# Patient Record
Sex: Male | Born: 1945
Health system: Southern US, Community
[De-identification: ages and names within clinical notes are randomized; demographics above are authoritative.]

## PROBLEM LIST (undated history)

## (undated) DIAGNOSIS — M810 Age-related osteoporosis without current pathological fracture: Secondary | ICD-10-CM

## (undated) DIAGNOSIS — K635 Polyp of colon: Secondary | ICD-10-CM

## (undated) DIAGNOSIS — R7301 Impaired fasting glucose: Secondary | ICD-10-CM

## (undated) DIAGNOSIS — I2510 Atherosclerotic heart disease of native coronary artery without angina pectoris: Secondary | ICD-10-CM

## (undated) DIAGNOSIS — Z862 Personal history of diseases of the blood and blood-forming organs and certain disorders involving the immune mechanism: Secondary | ICD-10-CM

## (undated) DIAGNOSIS — E785 Hyperlipidemia, unspecified: Secondary | ICD-10-CM

## (undated) DIAGNOSIS — E7800 Pure hypercholesterolemia, unspecified: Secondary | ICD-10-CM

## (undated) DIAGNOSIS — I319 Disease of pericardium, unspecified: Secondary | ICD-10-CM

## (undated) DIAGNOSIS — L570 Actinic keratosis: Secondary | ICD-10-CM

## (undated) DIAGNOSIS — I25119 Atherosclerotic heart disease of native coronary artery with unspecified angina pectoris: Secondary | ICD-10-CM

## (undated) DIAGNOSIS — G7000 Myasthenia gravis without (acute) exacerbation: Secondary | ICD-10-CM

## (undated) DIAGNOSIS — N400 Enlarged prostate without lower urinary tract symptoms: Secondary | ICD-10-CM

## (undated) HISTORY — DX: Actinic keratosis: L57.0

## (undated) HISTORY — DX: Polyp of colon: K63.5

## (undated) HISTORY — PX: INGUINAL HERNIA REPAIR: SUR1180

## (undated) HISTORY — DX: Age-related osteoporosis without current pathological fracture: M81.0

## (undated) HISTORY — DX: Myasthenia gravis without (acute) exacerbation: G70.00

## (undated) HISTORY — DX: Impaired fasting glucose: R73.01

## (undated) HISTORY — DX: Pure hypercholesterolemia, unspecified: E78.00

## (undated) HISTORY — DX: Personal history of diseases of the blood and blood-forming organs and certain disorders involving the immune mechanism: Z86.2

## (undated) HISTORY — DX: Atherosclerotic heart disease of native coronary artery with unspecified angina pectoris: I25.119

## (undated) HISTORY — DX: Benign prostatic hyperplasia without lower urinary tract symptoms: N40.0

## (undated) HISTORY — PX: TONSILLECTOMY: SUR1361

## (undated) HISTORY — DX: Atherosclerotic heart disease of native coronary artery without angina pectoris: I25.10

---

## 2000-02-13 ENCOUNTER — Encounter: Payer: Self-pay | Admitting: Internal Medicine

## 2000-02-13 ENCOUNTER — Observation Stay (HOSPITAL_COMMUNITY): Admission: EM | Admit: 2000-02-13 | Discharge: 2000-02-13 | Payer: Self-pay | Admitting: Emergency Medicine

## 2000-02-13 ENCOUNTER — Encounter: Payer: Self-pay | Admitting: Emergency Medicine

## 2000-12-09 ENCOUNTER — Emergency Department (HOSPITAL_COMMUNITY): Admission: EM | Admit: 2000-12-09 | Discharge: 2000-12-09 | Payer: Self-pay | Admitting: Emergency Medicine

## 2000-12-09 ENCOUNTER — Encounter: Payer: Self-pay | Admitting: Cardiology

## 2004-07-02 ENCOUNTER — Ambulatory Visit (HOSPITAL_COMMUNITY): Admission: AD | Admit: 2004-07-02 | Discharge: 2004-07-02 | Payer: Self-pay | Admitting: Urology

## 2004-07-02 ENCOUNTER — Encounter: Admission: RE | Admit: 2004-07-02 | Discharge: 2004-07-02 | Payer: Self-pay | Admitting: Family Medicine

## 2004-07-03 ENCOUNTER — Encounter (INDEPENDENT_AMBULATORY_CARE_PROVIDER_SITE_OTHER): Payer: Self-pay | Admitting: *Deleted

## 2004-10-20 ENCOUNTER — Ambulatory Visit (HOSPITAL_COMMUNITY): Admission: RE | Admit: 2004-10-20 | Discharge: 2004-10-20 | Payer: Self-pay | Admitting: Gastroenterology

## 2004-10-20 ENCOUNTER — Encounter (INDEPENDENT_AMBULATORY_CARE_PROVIDER_SITE_OTHER): Payer: Self-pay | Admitting: Specialist

## 2006-01-12 ENCOUNTER — Ambulatory Visit (HOSPITAL_BASED_OUTPATIENT_CLINIC_OR_DEPARTMENT_OTHER): Admission: RE | Admit: 2006-01-12 | Discharge: 2006-01-12 | Payer: Self-pay | Admitting: Surgery

## 2006-10-18 ENCOUNTER — Encounter: Admission: RE | Admit: 2006-10-18 | Discharge: 2006-11-22 | Payer: Self-pay | Admitting: Family Medicine

## 2008-05-09 ENCOUNTER — Encounter: Admission: RE | Admit: 2008-05-09 | Discharge: 2008-05-09 | Payer: Self-pay | Admitting: Family Medicine

## 2010-10-27 ENCOUNTER — Other Ambulatory Visit: Payer: Self-pay | Admitting: Gastroenterology

## 2011-01-30 NOTE — Op Note (Signed)
NAMEFABRIZIO, FILIP                ACCOUNT NO.:  [id number]   MEDICAL RECORD NO.:  [id number]          PATIENT TYPE:  AMB   LOCATION:  DAY                          FACILITY:  [facility]   PHYSICIAN:  Maretta Bees. Vonita Moss, M.D.DATE OF BIRTH:  1946-03-22   DATE OF PROCEDURE:  07/02/2004  DATE OF DISCHARGE:                                 OPERATIVE REPORT   PREOPERATIVE DIAGNOSIS:  Torsion of left spermatic cord.   POSTOPERATIVE DIAGNOSIS:  Torsion of left spermatic cord.   PROCEDURES:  1.  Scrotal exploration.  2.  Left orchiectomy.  3.  Right testicular fixation.   SURGEON:  Maretta Bees. Vonita Moss, M.D.   ANESTHESIA:  General.   INDICATION:  This is a 65 year old white male who had the onset 2 nights ago  of left groin pain that was not all that severe and not associated with  nausea and vomiting typical of torsion but the pain persisted and he was  started on antibiotics yesterday by Dr. Idell Pickles and a Doppler ultrasound  ordered.  The results today at noon showed a left testicle that is somewhat  swollen with good blood flow to the right testicle and bilateral hydroceles.  I talked to the patient and his wife about scrotal exploration and my  suspicion it was spermatic cord torsion and concern about necrosis and  inability to salvage the testicle at this point.   PROCEDURE:  The patient was brought to the operating room and placed in the  supine position.  The external genitalia prepped and draped in the usual  fashion.  A midline scrotal incision was made and the left scrotal  compartment entered and hydrocele opened up.  The testicle delivered in the  wound.  He was noted to have a 720-degree twist of the left spermatic cord  and the left testicle and epididymis were dark blue in color.  The testicle  was placed in warm compresses and then I explored the right scrotum and  opened up the right tunica vaginalis and found a small epididymis testis  that I fulgurated and then sutured the  normal right testicle in place from  the anterior testis to the lateral wall of the scrotum using three  interrupted sutures of 3-0 black silk which secured the testicle in good  position in the right scrotal compartment.  At this point, the left testicle  was still dark blue in color and I felt that an orchiectomy was appropriate  in view of the risk of any atrophic-sensitive left testicle that is  nonfunctional.  The spermatic cord was dissected up and the vastus divided  and ligated with 0 chromic catgut.  The rest of the spermatic cord was  divided and ligated proximally with 0 chromic catgut and distally with 0  chromic catgut suture ligature.  The testicle was sent to pathology.  There  was good hemostasis and the scrotal incision was closed with a running  layer of 2-0 chromic catgut in the subdartos tissues and the scrotal skin  was closed with running 3-0 chromic catgut.  The wound was cleaned and  dressed  with Neosporin, dry sterile gauze, and a scrotal support.  He was  sent to the recovery room in good condition with essentially no blood loss  and he tolerated the procedure well.      LJP/MEDQ  D:  07/02/2004  T:  07/02/2004  Job:  [id number]   cc:   Dellis Anes. Idell Pickles, M.D.  [address]  [place]  [place] [address]  Fax: [phone]

## 2011-01-30 NOTE — Consult Note (Signed)
[place]. [facility]  Patient:    Sean Rosario, Sean Rosario                       MRN: [id number] Proc. Date: 12/09/00 Adm. Date:  [id number] Attending:  Devoria Albe Dictator:   Delano Metz, M.D.                          Consultation Report  CHIEF COMPLAINT:  Chest discomfort.  HISTORY OF PRESENT ILLNESS:  Sean Rosario is a 65 year old white male with no past medical problems who developed "chest discomfort" at approximately 12:45 a.m. on March 28.  The pain lasted throughout the night.  He slept for a while.  When he awoke the discomfort was still present.  He did not describe it as pain or pressure.  It does not radiate.  It is substernal.  It is not associated with any nausea, vomiting, or diaphoresis.  It is not associated with shortness of breath.  It is worse with certain positional changes, especially lying in side, relieved when he sits up straight.  No dizziness or loss of consciousness.  Worse with deep inspiration.  Patient was seen at Dr. Nils Flack office this morning.  He was sent to the emergency department for evaluation.  He has had four baby aspirin.  PAST PROCEDURES:  Stress Cardiolite in June 2001 was negative.  There is no ischemia.  During the stress test he did have some chest pain that he described as 1-2/10 with no EKG changes.  EF was 69%.  PAST MEDICAL HISTORY:  None.  Nine months ago he came to the emergency department, was admitted for chest pain, ruled out for MI.  MEDICATIONS:  None.  ALLERGIES:  No known drug allergies.  FAMILY HISTORY:  Hypertension in his mother and father, diabetes mellitus in maternal grandfather.  No coronary artery disease.  No history of hyperlipidemia.  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  Lives with his wife. His father died approximately three weeks ago.  Never smoked.  Drinks five drinks per week on average.  REVIEW OF SYSTEMS:  Negative for loss of consciousness, headache,  nausea, vomiting, diarrhea.  Negative for any recent illness or weight loss.  Denies fevers, shortness of breath, cough, abdominal pain, dysuria, hematuria, or joint pain.  PHYSICAL EXAMINATION  VITAL SIGNS:  Temperature 97.8, blood pressure 114/77, pulse 69, respirations 20, O2 saturation 100% on room air.  GENERAL:  Patient in no acute distress.  Alert and oriented x 3.  HEENT:  Pupils are equal, round and reactive to light.  Extraocular movements are intact.  NECK:  Supple.  No lymphadenopathy.  No carotid bruits.  CARDIOVASCULAR:  S1, S2.  Regular rate and rhythm.  No murmurs, gallops, or rubs.  LUNGS:  Clear to auscultation bilaterally.  No wheezes, rales, or rhonchi.  ABDOMEN:  Positive bowel sounds, soft, nontender, nondistended.  NEUROLOGIC:  No focal deficits.  Cranial nerves 2-12 grossly intact. Sensation grossly intact.  EXTREMITIES:  No cyanosis, clubbing, or edema.  LABORATORIES:  CK 93, MB 1.3, troponin I 0.01.  Sodium 136, potassium 3.8, chloride 103, bicarbonate 29, BUN 19, creatinine 0.9, glucose 93, calcium 9.3. PTT 27, INR 0.9.  White blood cell count 9.8, hemoglobin 15.8, hematocrit 46.4.  Glucose 279.  Chest x-ray showed no acute disease, read as COPD with some mild flattening of the diaphragm.  EKG shows diffuse ST elevation.  There is  also depressed PR in lead 2.  ASSESSMENT AND PLAN:  A 65 year old white male with atypical chest pain which he describes as discomfort.  Doubt ischemia as a cause of this discomfort. More likely it is musculoskeletal versus pericardial inflammation especially given the worsening with positional change.  May consider echocardiogram to rule out pericarditis.  Another option is stress testing.  Will discuss with Dr. Amil Amen.  Addendum:  A GI cocktail did not relieve the discomfort.  Patient was discussed with Dr. Amil Amen who believed this to be pericarditis.  Will give 4 mg IV Decadron x 1, then Indocin 25 mg p.o.  t.i.d. x 7 days. DD:  12/09/00 TD:  12/09/00 Job: [id number]

## 2011-01-30 NOTE — Op Note (Signed)
NAMETRAYVEN, LUMADUE                ACCOUNT NO.:  [id number]   MEDICAL RECORD NO.:  [id number]          PATIENT TYPE:  AMB   LOCATION:  ENDO                         [facility]ACILITY:  [facility]   PHYSICIAN:  Danise Edge, M.D.   DATE OF BIRTH:  1945/10/30   DATE OF PROCEDURE:  10/20/2004  DATE OF DISCHARGE:                                 OPERATIVE REPORT   PROCEDURE:  Colonoscopy and rectal polypectomy.   PROCEDURE INDICATIONS:  Mr. Sean Rosario is a 65 year old male born Feb 11, 1946.  [name] is scheduled to undergo his first screening colonoscopy  with polypectomy to prevent colon cancer.   ENDOSCOPIST:  Danise Edge, M.D.   PREMEDICATION:  1.  Versed 7 mg.  2.  Demerol 60 mg.   PROCEDURE:  After obtaining informed consent, [name] was placed in the  left lateral decubitus position.  I administered intravenous Demerol and  intravenous Versed to achieve conscious sedation for the procedure.  The  patient's blood pressure, oxygen saturation, and cardiac rhythm were  monitored throughout the procedure and documented in the medical record.   Anal inspection was normal.  Digital rectal exam reveals a soft nodule in  the right lobe of the prostate.  According to the medical records. Mr.  Jamar has been evaluated for a prostate nodule. The Olympus pediatric  colonoscope was introduced into the rectum and advanced to the cecum.  Colonic preparation for the exam today was satisfactory.   Rectum:  From the proximal rectum, a 1 mm sessile polyp was removed with the  cold biopsy forceps.   Sigmoid colon and descending colon normal.   Splenic flexure normal.   Transverse colon normal.   Hepatic flexure normal.   Ascending colon normal.   Cecum and ileocecal valve normal.   ASSESSMENT:  A diminutive polyp was removed from the proximal rectum,  otherwise normal screening proctocolonoscopy to the cecum.      MJ/MEDQ  D:  10/20/2004  T:  10/20/2004  Job:  [id number]   cc:    Dellis Anes. Idell Pickles, M.D.  [address]  [place]  [place] [address]  Fax: [phone]

## 2011-01-30 NOTE — Discharge Summary (Signed)
[place]. [facility]  Patient:    Sean Rosario, Sean Rosario                       MRN: [id number] Adm. Date:  [id number] Disc. Date: [id number] Attending:  Nathen May Dictator:   Lavella Hammock, P.A. CC:         Dellis Anes. Idell Pickles, M.D., [facility]             Nathen May, M.D., [facility]                           Discharge Summary  DATE OF BIRTH:  21-Dec-1945  PROCEDURES:  Stress Cardiolite.  HOSPITAL COURSE:  Mr. Weddington is a 65 year old male patient with no known history of coronary artery disease who had chest pain on the day before admission that lasted several hours and he came to the emergency room, where he got nitroglycerin and had some improvement.  He had some persistence this a.m. but it improved with position changes.  He was admitted to rule out MI and for further changes.  He had negative enzymes greater than 12 hours apart and was scheduled for a stress Cardiolite.  He had a stress Cardiolite on the afternoon of February 13, 2000 and he exercised for a total of 10 minutes and 25 seconds on the Bruce protocol, reaching a target heart rate of 140.  His maximal heart rate seen was 151.  He had a chest pain at 1 or 2/10 described as a tightness prior to exertion and this continued without change throughout the test.  He had no significant EKG changes.  His Cardiolite showed no scar and no ischemia and an EF of 69%.  His chest pain resolved.  With the chest pain that resolved and a normal Cardiolite and no MI by enzymes, he was considered stable for discharge on February 13, 2000 p.m.  LABORATORY DATA:  Total cholesterol 220, triglycerides 92, HDL 57, LDL 145. Hemoglobin 14.9, hematocrit 41.9, WBC 11.8, platelets 269.  CONDITION ON DISCHARGE:  Stable.  CONSULTATIONS:  None.  COMPLICATIONS:  None.  DISCHARGE DIAGNOSES: 1. Chest pain, the patient reports a recent history of reflux and increased    stress secondary to death of  someone he was close to.  He is going home on    Protonix 40 mg q.d. and is to follow up with Dr. Idell Pickles. 2. Hyperlipidemia. 3. Preserved left ventricular function.  DISCHARGE INSTRUCTIONS:  His activity level is to be as tolerated.  He is to stick to a low fat diet.  FOLLOW-UP:  He is to follow up with Dr. Graciela Husbands on a p.r.n. basis.  He is to follow up with Dr. Idell Pickles and call for an appointment.  DISCHARGE MEDICATIONS:  Protonix 40 mg q.d. DD:  02/13/00 TD:  02/14/00 Job: [id number]

## 2011-01-30 NOTE — Op Note (Signed)
NAMEYOANDRI, CONGROVE                ACCOUNT NO.:  [id number]   MEDICAL RECORD NO.:  [id number]          PATIENT TYPE:  AMB   LOCATION:  DSC                          FACILITY:  [facility]   PHYSICIAN:  Wilmon Arms. Corliss Skains, M.D. DATE OF BIRTH:  1945/09/24   DATE OF PROCEDURE:  01/12/2006  DATE OF DISCHARGE:                                 OPERATIVE REPORT   PREOPERATIVE DIAGNOSIS:  Right inguinal hernia.   POSTOPERATIVE DIAGNOSIS:  Right inguinal hernia.   PROCEDURE PERFORMED:  Right inguinal hernia repair with mesh.   SURGEON:  Wilmon Arms. Tsuei, M.D.   ANESTHESIA:  General via LMA.   INDICATIONS:  The patient is a 65 year old male in excellent health who  presented with a right groin bulge.  He had minimal discomfort in this area.  He was evaluated and was noted to have a right inguinal hernia.  We  discussed the options for treatment and we elected to proceed with surgical  repair.   DESCRIPTION OF PROCEDURE:  The patient was brought to the operating room and  placed in supine position on the operating table.  After an adequate level  of general anesthesia was obtained, the patient's right groin was shaved,  prepped with Betadine, and draped in a sterile fashion.  An oblique incision  was made above the inguinal ligament.  Dissection was carried down through  the subcutaneous tissues to the external oblique fascia.  There were two  split areas noted in the external oblique fascia running towards the  external inguinal ring.  The fascia was opened along the direction of its  fibers down to the external ring.  The spermatic cord was circumferentially  dissected and encircled with a Penrose drain.  The cord was skeletonized and  no indirect hernia sac was noted.  A medium sized direct defect was noted in  the floor of the canal.  A Ray-Tec sponge was placed in the preperitoneal  space through the direct defect.  A large PHS mesh was brought onto the  field.  The Ray-Tec sponge was removed  and the mesh was inserted into the  preperitoneal space.  The underlay mesh was deployed with the finger tucking  it down behind Cooper's ligament, laterally behind the inferior epigastric  vessels, medially behind the rectus muscle.  The onlay mesh was opened and a  small opening was cut for the spermatic cord.  The mesh was then secured  with interrupted 2-0 Vicryl sutures.  The tails were tucked laterally under  the edge of the external oblique fascia.  A 0 Vicryl was then used to  reapproximate the edges of the external oblique fascia.  3-0 Vicryl was used  to close the subcutaneous tissues and 4-0 Monocryl was used to close the  skin.  A total of 20 mL of 0.25% Marcaine were used to infiltrate the  subcutaneous tissues.  Steri-Strips and clean dressings were applied.  The  patient was then extubated and brought to the recovery in stable condition.  All sponge and instrument counts were correct.      Wilmon Arms. Tsuei, M.D.  Electronically Signed    MKT/MEDQ  D:  01/12/2006  T:  01/12/2006  Job:  [id number]

## 2011-11-11 ENCOUNTER — Encounter (HOSPITAL_COMMUNITY): Payer: Self-pay | Admitting: *Deleted

## 2011-11-11 ENCOUNTER — Observation Stay (HOSPITAL_COMMUNITY): Payer: Medicare Other

## 2011-11-11 ENCOUNTER — Observation Stay (HOSPITAL_COMMUNITY)
Admission: EM | Admit: 2011-11-11 | Discharge: 2011-11-12 | Disposition: A | Discharge status: Home / Self Care | Payer: Medicare Other | Source: Ambulatory Visit | Attending: Cardiology | Admitting: Cardiology

## 2011-11-11 ENCOUNTER — Other Ambulatory Visit: Payer: Self-pay

## 2011-11-11 ENCOUNTER — Emergency Department (HOSPITAL_COMMUNITY): Payer: Medicare Other

## 2011-11-11 DIAGNOSIS — G7000 Myasthenia gravis without (acute) exacerbation: Secondary | ICD-10-CM | POA: Insufficient documentation

## 2011-11-11 DIAGNOSIS — T380X5A Adverse effect of glucocorticoids and synthetic analogues, initial encounter: Secondary | ICD-10-CM | POA: Insufficient documentation

## 2011-11-11 DIAGNOSIS — F1010 Alcohol abuse, uncomplicated: Secondary | ICD-10-CM | POA: Insufficient documentation

## 2011-11-11 DIAGNOSIS — R091 Pleurisy: Secondary | ICD-10-CM | POA: Insufficient documentation

## 2011-11-11 DIAGNOSIS — E785 Hyperlipidemia, unspecified: Secondary | ICD-10-CM | POA: Insufficient documentation

## 2011-11-11 DIAGNOSIS — R079 Chest pain, unspecified: Principal | ICD-10-CM

## 2011-11-11 DIAGNOSIS — M810 Age-related osteoporosis without current pathological fracture: Secondary | ICD-10-CM | POA: Insufficient documentation

## 2011-11-11 DIAGNOSIS — M818 Other osteoporosis without current pathological fracture: Secondary | ICD-10-CM | POA: Insufficient documentation

## 2011-11-11 DIAGNOSIS — I2584 Coronary atherosclerosis due to calcified coronary lesion: Secondary | ICD-10-CM | POA: Insufficient documentation

## 2011-11-11 DIAGNOSIS — I2510 Atherosclerotic heart disease of native coronary artery without angina pectoris: Secondary | ICD-10-CM | POA: Insufficient documentation

## 2011-11-11 HISTORY — DX: Hyperlipidemia, unspecified: E78.5

## 2011-11-11 HISTORY — DX: Disease of pericardium, unspecified: I31.9

## 2011-11-11 LAB — DIFFERENTIAL
Basophils Relative: 0 % (ref 0–1)
Eosinophils Absolute: 0.1 10*3/uL (ref 0.0–0.7)
Monocytes Absolute: 0.7 10*3/uL (ref 0.1–1.0)
Monocytes Relative: 9 % (ref 3–12)

## 2011-11-11 LAB — POCT I-STAT TROPONIN I
Troponin i, poc: 0 ng/mL (ref 0.00–0.08)
Troponin i, poc: 0 ng/mL (ref 0.00–0.08)

## 2011-11-11 LAB — CBC
HCT: 42.8 % (ref 39.0–52.0)
Hemoglobin: 14.8 g/dL (ref 13.0–17.0)
MCH: 31.9 pg (ref 26.0–34.0)
MCHC: 34.6 g/dL (ref 30.0–36.0)

## 2011-11-11 LAB — COMPREHENSIVE METABOLIC PANEL
Albumin: 4 g/dL (ref 3.5–5.2)
BUN: 17 mg/dL (ref 6–23)
Creatinine, Ser: 0.76 mg/dL (ref 0.50–1.35)
Total Protein: 7.3 g/dL (ref 6.0–8.3)

## 2011-11-11 MED ORDER — SODIUM CHLORIDE 0.9 % IJ SOLN: 3.0000 mL | INTRAMUSCULAR | Status: DC | PRN

## 2011-11-11 MED ORDER — METOPROLOL TARTRATE 1 MG/ML IV SOLN
INTRAVENOUS | Status: AC
  Filled 2011-11-11: qty 5

## 2011-11-11 MED ORDER — ACETAMINOPHEN 325 MG PO TABS: 650.0000 mg | ORAL_TABLET | ORAL | Status: DC | PRN

## 2011-11-11 MED ORDER — IOHEXOL 350 MG/ML SOLN: 80.0000 mL | Freq: Once | INTRAVENOUS | Status: DC | PRN

## 2011-11-11 MED ORDER — ASPIRIN EC 81 MG PO TBEC
81.0000 mg | DELAYED_RELEASE_TABLET | Freq: Every day | ORAL | Status: DC
Start: 2011-11-12 — End: 2011-11-12
  Administered 2011-11-12: 81 mg via ORAL
  Filled 2011-11-11: qty 1

## 2011-11-11 MED ORDER — METOPROLOL TARTRATE 25 MG PO TABS
50.0000 mg | ORAL_TABLET | Freq: Once | ORAL | Status: AC
  Administered 2011-11-11: 50 mg via ORAL
  Filled 2011-11-11: qty 2

## 2011-11-11 MED ORDER — ASPIRIN 81 MG PO CHEW
324.0000 mg | CHEWABLE_TABLET | Freq: Once | ORAL | Status: AC
  Administered 2011-11-11: 324 mg via ORAL
  Filled 2011-11-11: qty 4

## 2011-11-11 MED ORDER — SODIUM CHLORIDE 0.9 % IJ SOLN
3.0000 mL | Freq: Two times a day (BID) | INTRAMUSCULAR | Status: DC
  Administered 2011-11-11 – 2011-11-12 (×2): 3 mL via INTRAVENOUS

## 2011-11-11 MED ORDER — VITAMIN D3 25 MCG (1000 UNIT) PO TABS
1000.0000 [IU] | ORAL_TABLET | Freq: Every day | ORAL | Status: DC
  Administered 2011-11-12: 1000 [IU] via ORAL
  Filled 2011-11-11: qty 1

## 2011-11-11 MED ORDER — HEPARIN SODIUM (PORCINE) 5000 UNIT/ML IJ SOLN
5000.0000 [IU] | Freq: Three times a day (TID) | INTRAMUSCULAR | Status: DC
  Administered 2011-11-11 – 2011-11-12 (×3): 5000 [IU] via SUBCUTANEOUS
  Filled 2011-11-11 (×5): qty 1

## 2011-11-11 MED ORDER — METOPROLOL TARTRATE 1 MG/ML IV SOLN: 5.0000 mg | Freq: Once | INTRAVENOUS | Status: AC

## 2011-11-11 MED ORDER — ACETAMINOPHEN 500 MG PO TABS
1000.0000 mg | ORAL_TABLET | ORAL | Status: AC
  Administered 2011-11-11: 1000 mg via ORAL
  Filled 2011-11-11: qty 2

## 2011-11-11 MED ORDER — NITROGLYCERIN 0.4 MG SL SUBL
0.4000 mg | SUBLINGUAL_TABLET | SUBLINGUAL | Status: DC | PRN
  Administered 2011-11-11: 0.4 mg via SUBLINGUAL
  Filled 2011-11-11: qty 25

## 2011-11-11 MED ORDER — ONDANSETRON HCL 4 MG/2ML IJ SOLN: 4.0000 mg | Freq: Four times a day (QID) | INTRAMUSCULAR | Status: DC | PRN

## 2011-11-11 MED ORDER — ZOLPIDEM TARTRATE 5 MG PO TABS: 5.0000 mg | ORAL_TABLET | Freq: Every evening | ORAL | Status: DC | PRN

## 2011-11-11 MED ORDER — PYRIDOSTIGMINE BROMIDE 60 MG PO TABS
60.0000 mg | ORAL_TABLET | Freq: Every day | ORAL | Status: DC | PRN
  Filled 2011-11-11: qty 1

## 2011-11-11 MED ORDER — ATORVASTATIN CALCIUM 10 MG PO TABS
20.0000 mg | ORAL_TABLET | Freq: Every day | ORAL | Status: DC
  Administered 2011-11-11: 20 mg via ORAL
  Filled 2011-11-11 (×2): qty 2

## 2011-11-11 MED ORDER — OMEGA-3-ACID ETHYL ESTERS 1 G PO CAPS
1.0000 g | ORAL_CAPSULE | Freq: Every day | ORAL | Status: DC
  Administered 2011-11-12: 1 g via ORAL
  Filled 2011-11-11: qty 1

## 2011-11-11 MED ORDER — NITROGLYCERIN 0.4 MG SL SUBL
0.4000 mg | SUBLINGUAL_TABLET | Freq: Once | SUBLINGUAL | Status: AC
  Administered 2011-11-11: 0.4 mg via SUBLINGUAL

## 2011-11-11 MED ORDER — ADULT MULTIVITAMIN W/MINERALS CH
1.0000 | ORAL_TABLET | Freq: Every day | ORAL | Status: DC
  Administered 2011-11-12: 1 via ORAL
  Filled 2011-11-11: qty 1

## 2011-11-11 MED ORDER — ALPRAZOLAM 0.25 MG PO TABS: 0.2500 mg | ORAL_TABLET | Freq: Two times a day (BID) | ORAL | Status: DC | PRN

## 2011-11-11 MED ORDER — SODIUM CHLORIDE 0.9 % IV SOLN: 250.0000 mL | INTRAVENOUS | Status: DC | PRN

## 2011-11-11 NOTE — ED Notes (Signed)
Patient is resting comfortably. 

## 2011-11-11 NOTE — ED Notes (Signed)
Family at bedside. 

## 2011-11-11 NOTE — ED Notes (Signed)
Pt transported to CT with RN

## 2011-11-11 NOTE — ED Notes (Signed)
bp 135/93 prior to nitro

## 2011-11-11 NOTE — ED Provider Notes (Signed)
History     CSN: [id number]  Arrival date & time 11/11/11  7829   First MD Initiated Contact with Patient 11/11/11 [phone]      Chief Complaint  Patient presents with  . Chest Pain    (Consider location/radiation/quality/duration/timing/severity/associated sxs/prior treatment) HPI  Past Medical History  Diagnosis Date  . Pericarditis     History reviewed. No pertinent past surgical history.  History reviewed. No pertinent family history.  History  Substance Use Topics  . Smoking status: Never Smoker   . Smokeless tobacco: Not on file  . Alcohol Use: 6.0 oz/week    10 Glasses of wine per week      Review of Systems  Allergies  Review of patient's allergies indicates no known allergies.  Home Medications   Current Outpatient Rx  Name Route Sig Dispense Refill  . ALENDRONATE SODIUM 40 MG PO TABS Oral Take 40 mg by mouth every 7 (seven) days. Take with a full glass of water on an empty stomach.    Marland Kitchen VITAMIN D PO Oral Take 1 tablet by mouth daily.    Carma Leaven M PLUS PO TABS Oral Take 1 tablet by mouth daily.    Marland Kitchen FISH OIL 1200 MG PO CAPS Oral Take 1 capsule by mouth daily.    Marland Kitchen PYRIDOSTIGMINE BROMIDE 60 MG PO TABS Oral Take 60 mg by mouth See admin instructions. Patient takes 4 to 5 times per day, depends on whether he has double vision or any other symptoms.      BP 117/70  Pulse 62  Temp(Src) 98.2 F (36.8 C) (Oral)  Resp 18  SpO2 99%  Physical Exam  ED Course  Procedures (including critical care time)  Labs Reviewed  DIFFERENTIAL - Abnormal; Notable for the following:    Neutrophils Relative 78 (*)    All other components within normal limits  CBC  COMPREHENSIVE METABOLIC PANEL  POCT I-STAT TROPONIN I  D-DIMER, QUANTITATIVE  POCT I-STAT TROPONIN I   Dg Chest Portable 1 View  11/11/2011  *RADIOLOGY REPORT*  Clinical Data: Chest tightness.  PORTABLE CHEST - 1 VIEW  Comparison: None.  Findings: Trachea is midline.  Heart size normal.  Biapical pleural  parenchymal scarring, right greater than left.  Mild bibasilar atelectasis, left greater than right.  No pleural fluid.  IMPRESSION: Low lung volumes with bibasilar atelectasis.  Original Report Authenticated By: Reyes Ivan, M.D.     No diagnosis found.    MDM  2:55pm Report received  from Dr. Lynelle Doctor by mouth patient is to come to the CDU for chest pain protocol. He will be set up for the CT cardiac. Only risk factors are hyperlipidemia which is not treated with medication. TIMI score of 1 and a heart score of 0 BMI is       Troponin is 0.0, D dimer is .29. Chest x-ray normal.  Woke with midsternal tightness non radiating. 3:30 Report given to [facility] . Patient is for CDU chest pain protocol.  Will possibly have his CT heart today.          Jethro Bastos, NP 11/11/11 1544

## 2011-11-11 NOTE — ED Notes (Signed)
Patient denies pain and is resting comfortably.  

## 2011-11-11 NOTE — ED Notes (Signed)
Returned from ct 

## 2011-11-11 NOTE — ED Notes (Addendum)
BMI 21.5

## 2011-11-11 NOTE — ED Notes (Signed)
[phone] Harriett Sine, wife, is to be called with any changes

## 2011-11-11 NOTE — ED Provider Notes (Cosign Needed)
History     CSN: [id number]  Arrival date & time 11/11/11  4098   First MD Initiated Contact with Patient 11/11/11 [id number]      Chief Complaint  Patient presents with  . Chest Pain    (Consider location/radiation/quality/duration/timing/severity/associated sxs/prior treatment) HPI  Patient denies any change in activity although he states he is under some stress. Patient has myasthenia gravis and he is considering doing immune globulin IV. He states he has to make the decision in 2 more days. He relates he was in the ER last night with a neighbor. He states this morning he was awakened at 5 AM with a diffuse central chest tightness. He denies radiation. He states he's never had it before.  He denies nausea, vomiting, diaphoresis, shortness of breath, he states sometimes deep breathing makes him  hurt more, he states nothing makes it feel better. He states his pain at its worst was a 3 but he did wake him up from sleeping he states currently is a 1-2. He states 20 years ago he had pericarditis however he does not recall at this pain is similar.  PCP Dr. Caryn Bee little  Past Medical History  Diagnosis Date  . Pericarditis    myasthenia gravis High cholesterol, was on lipitor now treated with diet.   History reviewed. No pertinent past surgical history.  History reviewed. No pertinent family history. FOP heart valve replacements, HTN died MOP living 85 HTN  History  Substance Use Topics  . Smoking status: Never Smoker   . Smokeless tobacco: Not on file  . Alcohol Use: 6.0 oz/week    10 Glasses of wine per week   Retired from [employer] with spouse   Review of Systems  All other systems reviewed and are negative.    Allergies  Review of patient's allergies indicates no known allergies.  Home Medications   Current Outpatient Rx  Name Route Sig Dispense Refill  . ALENDRONATE SODIUM 40 MG PO TABS Oral Take 40 mg by mouth every 7 (seven) days. Take with a full glass of  water on an empty stomach.    Marland Kitchen VITAMIN D PO Oral Take 1 tablet by mouth daily.    Carma Leaven M PLUS PO TABS Oral Take 1 tablet by mouth daily.    Marland Kitchen FISH OIL 1200 MG PO CAPS Oral Take 1 capsule by mouth daily.    Marland Kitchen PYRIDOSTIGMINE BROMIDE 60 MG PO TABS Oral Take 60 mg by mouth See admin instructions. Patient takes 4 to 5 times per day, depends on whether he has double vision or any other symptoms.      BP 145/82  Pulse 57  Temp(Src) 98.2 F (36.8 C) (Oral)  Resp 18  SpO2 99%  Vital signs normal    Physical Exam  Nursing note and vitals reviewed. Constitutional: He is oriented to person, place, and time. He appears well-developed and well-nourished.  Non-toxic appearance. He does not appear ill. No distress.  HENT:  Head: Normocephalic and atraumatic.  Right Ear: External ear normal.  Left Ear: External ear normal.  Nose: Nose normal. No mucosal edema or rhinorrhea.  Mouth/Throat: Oropharynx is clear and moist and mucous membranes are normal. No dental abscesses or uvula swelling.  Eyes: Conjunctivae and EOM are normal. Pupils are equal, round, and reactive to light.  Neck: Normal range of motion and full passive range of motion without pain. Neck supple.  Cardiovascular: Normal rate, regular rhythm and normal heart sounds.  Exam reveals no gallop  and no friction rub.   No murmur heard. Pulmonary/Chest: Effort normal and breath sounds normal. No respiratory distress. He has no wheezes. He has no rhonchi. He has no rales. He exhibits no tenderness and no crepitus.  Abdominal: Soft. Normal appearance and bowel sounds are normal. He exhibits no distension. There is no tenderness. There is no rebound and no guarding.  Musculoskeletal: Normal range of motion. He exhibits no edema and no tenderness.       Moves all extremities well.   Neurological: He is alert and oriented to person, place, and time. He has normal strength. No cranial nerve deficit.  Skin: Skin is warm, dry and intact. No  rash noted. No erythema. No pallor.  Psychiatric: He has a normal mood and affect. His speech is normal and behavior is normal. His mood appears not anxious.    ED Course  Procedures (including critical care time)  Pt had hypotensive episode after having IV started and getting SL NTG. He responded to IV fluid bolus. He has not had any chest pain during his ED visit.  I have talked to patient and family and he is concerned with the new IGIV treatments which can also cause chest tightness and would like to have the issue cleared up.    14:50 Discussed with A Crawford, NP will put on chest pain protocol   Results for orders placed during the hospital encounter of 11/11/11  CBC      Component Value Range   WBC 8.3  4.0 - 10.5 (K/uL)   RBC 4.64  4.22 - 5.81 (MIL/uL)   Hemoglobin 14.8  13.0 - 17.0 (g/dL)   HCT 13.2  44.0 - 10.2 (%)   MCV 92.2  78.0 - 100.0 (fL)   MCH 31.9  26.0 - 34.0 (pg)   MCHC 34.6  30.0 - 36.0 (g/dL)   RDW 72.5  36.6 - 44.0 (%)   Platelets 247  150 - 400 (K/uL)  DIFFERENTIAL      Component Value Range   Neutrophils Relative 78 (*) 43 - 77 (%)   Neutro Abs 6.5  1.7 - 7.7 (K/uL)   Lymphocytes Relative 12  12 - 46 (%)   Lymphs Abs 1.0  0.7 - 4.0 (K/uL)   Monocytes Relative 9  3 - 12 (%)   Monocytes Absolute 0.7  0.1 - 1.0 (K/uL)   Eosinophils Relative 1  0 - 5 (%)   Eosinophils Absolute 0.1  0.0 - 0.7 (K/uL)   Basophils Relative 0  0 - 1 (%)   Basophils Absolute 0.0  0.0 - 0.1 (K/uL)  COMPREHENSIVE METABOLIC PANEL      Component Value Range   Sodium 140  135 - 145 (mEq/L)   Potassium 3.6  3.5 - 5.1 (mEq/L)   Chloride 105  96 - 112 (mEq/L)   CO2 25  19 - 32 (mEq/L)   Glucose, Bld 96  70 - 99 (mg/dL)   BUN 17  6 - 23 (mg/dL)   Creatinine, Ser 3.47  0.50 - 1.35 (mg/dL)   Calcium 9.6  8.4 - 42.5 (mg/dL)   Total Protein 7.3  6.0 - 8.3 (g/dL)   Albumin 4.0  3.5 - 5.2 (g/dL)   AST 27  0 - 37 (U/L)   ALT 27  0 - 53 (U/L)   Alkaline Phosphatase 67  39 - 117  (U/L)   Total Bilirubin 0.6  0.3 - 1.2 (mg/dL)   GFR calc non Af Amer >90  >90 (  mL/min)   GFR calc Af Amer >90  >90 (mL/min)  POCT I-STAT TROPONIN I      Component Value Range   Troponin i, poc 0.00  0.00 - 0.08 (ng/mL)   Comment 3           D-DIMER, QUANTITATIVE      Component Value Range   D-Dimer, Quant 0.29  0.00 - 0.48 (ug/mL-FEU)  POCT I-STAT TROPONIN I      Component Value Range   Troponin i, poc 0.00  0.00 - 0.08 (ng/mL)   Comment 3            Laboratory interpretation all normal   Dg Chest Portable 1 View  11/11/2011  *RADIOLOGY REPORT*  Clinical Data: Chest tightness.  PORTABLE CHEST - 1 VIEW  Comparison: None.  Findings: Trachea is midline.  Heart size normal.  Biapical pleural parenchymal scarring, right greater than left.  Mild bibasilar atelectasis, left greater than right.  No pleural fluid.  IMPRESSION: Low lung volumes with bibasilar atelectasis.  Original Report Authenticated By: Reyes Ivan, M.D.    Date: 11/11/2011  Rate: 56  Rhythm: sinus bradycardia  QRS Axis: normal  Intervals: normal  ST/T Wave abnormalities: early repolarization  Conduction Disutrbances:none  Narrative Interpretation:   Old EKG Reviewed: none available      1. Chest pain     Plan send to CDU for chest pain protocol  Devoria Albe, MD, FACEP   MDM          Ward Givens, MD 11/11/11 1610  Ward Givens, MD 11/11/11 9604

## 2011-11-11 NOTE — ED Provider Notes (Signed)
See CDU documentation  Ward Givens, MD 11/11/11 7829

## 2011-11-11 NOTE — ED Provider Notes (Signed)
4:56 PM Patient woke up this morning at 5:30 with substernal chest pain, described as tightness, that has slowly been improving throughout the day.  Pain free at present.  Received call from Dr Logan Bores regarding CT result.  Patient with significant CAD, calcium score 82nd percentile, also with incidental finding of pleural thickening.  Discussed results with Dr Fredricka Bonine who will call cardiology consult.  Discussed all results with patient and patient's family who verbalize understanding.    Patient to be admitted by [facility] cardiology.    Dillard Cannon [place], [place] 11/11/11 [phone]

## 2011-11-11 NOTE — H&P (Signed)
History and Physical  Patient ID: Sean Rosario MRN: [id number], SOB: 10-09-1945 66 y.o. Date of Encounter: 11/11/2011, 7:43 PM  Primary Physician: Mickie Hillier, MD, MD Primary Neurologist: New to [facility] Primary Cardiologist: New to [facility]  Chief Complaint: chest pain  HPI: 66 y/o very active M with hx of pericarditis, HL, myasthenia gravis presented to [facility] with complaints of chest pain. This morning he had substernal chest tightness that he believes woke him out of sleep, but states it was rather low-grade. It was non-radiating and not associated with any SOB, nausea, vomiting, diaphoresis, palpitations or syncope.  He got up and walked around, got ready for the morning which helped but the pain did not resolve completely. Drinking coffee also helped the discomfort. He has not had this pain before. He exercises 3-4x a week doing cardio and strength training along with yardwork like using a chainsaw without any symptoms of CP/SOB. (He hurt his back about a week ago so has laid off execise for the week but was able to work out last week with normal exercise tolerance.) When he got to the ER, he received ASA and NTG - however, the NTG made him diaphoretic, lightheaded, with hypotension and SBP 63/39. He is now pain free. He underwent cardiac CT demonstrating:  CORONARY ARTERIES: Left main coronary artery: Short vessel of normal caliber. There is an ecentric calcified plaque which is nonobstructive. Left anterior descending artery: There are two proximal eccentric calcified plaques. The second plaque demonstrates between 25 and 50% luminal narrowing. There is a prominent diagonal branch which also contains eccentric calcified plaque which is nonobstructive. Left circumflex: A large vessel with an early branching obtuse marginal. There is a small eccentric plaque within the proximal obtuse marginal. Right coronary artery: The right coronary is diminutive.  There are several eccentric calcified plaque Posterior descending artery: Normal caliber with nonobstructed calcified plaque Dominance: Left CORONARY CALCIUM: Total Agatston Score: 524 MESA database percentile: 65  We are asked to weigh in on these findings given his chest pain. D-dimer was negative. POC troponin was negative x2.   Past Medical History  Diagnosis Date  . Pericarditis     20 years ago  . Myasthenia gravis     Only manifested by intermittent double vision. Maintained on mestinon  . Osteoporosis     Secondary to prednisone from myasthenia gravis for several years  . Hyperlipidemia     Patient previously elected lifestyle changes in lieu of meds     Surgical History:  Past Surgical History  Procedure Date  . Inguinal hernia repair   . Tonsillectomy      Home Meds: Medication Sig  alendronate (FOSAMAX) 40 MG tablet Take 40 mg by mouth every 7 (seven) days. Take with a full glass of water on an empty stomach.  Cholecalciferol (VITAMIN D PO) Take 1 tablet by mouth daily.  Multiple Vitamins-Minerals (MULTIVITAMINS THER. W/MINERALS) TABS Take 1 tablet by mouth daily.  Omega-3 Fatty Acids (FISH OIL) 1200 MG CAPS Take 1 capsule by mouth daily.  pyridostigmine (MESTINON) 60 MG tablet Take 60 mg by mouth See admin instructions. Patient takes 4 to 5 times per day, depends on whether he has double vision or any other symptoms.    Allergies: No Known Allergies  History   Social History  . Marital Status: Married    Spouse Name: N/A    Number of Children: N/A  . Years of Education: N/A   Occupational History  . Not  on file.   Social History Main Topics  . Smoking status: Never Smoker   . Smokeless tobacco: Not on file  . Alcohol Use: 6.0 oz/week    10 Glasses of wine per week     2 glasses of wine per night most, but not all nights  . Drug Use: No  . Sexually Active: Not on file   Other Topics Concern  . Not on file   Social History Narrative  . No  narrative on file     Family History  Problem Relation Age of Onset  . ALS Father   . Heart defect Father     2 valve surgeries  . Hypertension Mother   . Hypertension Sister     Review of Systems: General: negative for chills, fever, night sweats or weight changes.  Cardiovascular: negative for edema, orthopnea, palpitations, paroxysmal nocturnal dyspnea, shortness of breath or dyspnea on exertion Dermatological: negative for rash Respiratory: negative for cough or wheezing Urologic: negative for hematuria Abdominal: negative for nausea, vomiting, diarrhea, bright red blood per rectum, melena, or hematemesis Neurologic: negative for visual changes, syncope, or dizziness. Chronic intermittent double vision, worse in the evening treated with mestinon (this is his manifestation of myasthenia) All other systems reviewed and are otherwise negative except as noted above.  Labs:   Lab Results  Component Value Date   WBC 8.3 11/11/2011   HGB 14.8 11/11/2011   HCT 42.8 11/11/2011   MCV 92.2 11/11/2011   PLT 247 11/11/2011     Lab 11/11/11 0923  NA 140  K 3.6  CL 105  CO2 25  BUN 17  CREATININE 0.76  CALCIUM 9.6  PROT 7.3  BILITOT 0.6  ALKPHOS 67  ALT 27  AST 27  GLUCOSE 96    Lab Results  Component Value Date   DDIMER 0.29 11/11/2011    Radiology/Studies:  1. Ct Heart Morp W/cta Cor W/score W/ca W/cm &/or Wo/cm 11/11/2011  *RADIOLOGY REPORT*  INDICATION:  Chest pain.  Hyperlipidemia.  CT ANGIOGRAPHY OF THE HEART, CORONARY ARTERY, STRUCTURE, AND MORPHOLOGY  CONTRAST:  80 ml Omnipaque  COMPARISON:  Chest radiograph 11/11/2011  TECHNIQUE:  CT angiography of the coronary vessels was performed on a 256 channel system using prospective ECG gating.  A scout and noncontrast exam (for calcium scoring) were performed.  Circulation time was measured using a test bolus.  Coronary CTA was performed with sub mm slice collimation during portions of the cardiac cycle after prior injection of  iodinated contrast.  Imaging post processing was performed on an independent workstation [email or web address]. reating multiplanar and 3-D images, and quantitative analysis of the heart and coronary arteries.  Note that this examtargets the heart and the chest was not imaged in its entirety.  PREMEDICATION: Lopressor 50 mg, P.O.  Nitroglycerin 400 mcg, sublingual.  FINDINGS: Technical quality:  Good  Heart rate:  51  CORONARY ARTERIES: Left main coronary artery:  Short vessel  of normal caliber.  There is an ecentric calcified plaque which is nonobstructive.  Left anterior descending artery: There are two proximal eccentric calcified plaques.  The second plaque demonstrates between 25 and 50% luminal narrowing.  There is a prominent diagonal branch which also contains eccentric calcified plaque which is nonobstructive.  Left circumflex:  A large vessel with an early branching obtuse marginal.  There is a small eccentric plaque within the proximal obtuse marginal.  Right coronary artery:  The right coronary is diminutive.  There are several eccentric calcified plaque Posterior  descending artery:  Normal caliber with nonobstructed calcified plaque Dominance:  Left  CORONARY CALCIUM: Total Agatston Score:  524 MESA database percentile:  82  CARDIAC MEASUREMENTS: Interventricular septum (6 - 12 mm): LV posterior Deante Blough (6 - 12 mm): LV diameter in diastole (35 - 52 mm):  AORTA AND PULMONARY MEASUREMENTS: Aortic root (21 - 40 mm):             30 mm  at the annulus             39 mm  at the sinuses of Valsalva             28 mm  at the sinotubular junction Ascending aorta ( <  40 mm):  31 mm Descending aorta ( <  40 mm):  There is a 25 mm Main pulmonary artery:  ( <  30 mm):  24 mm  EXTRACARDIAC FINDINGS: There is soft tissue thickening associated with a pleural plaque within the posterior right lower lobe. This is incompletely imaged.  The imaged portion measures 35 x 13 mm (image #1).  Mild bronchiectasis at the lung bases.  Limited view of  the upper abdomen is unremarkable.  View of the skeleton is unremarkable.  IMPRESSION:  1.  Extensive three-vessel eccentric calcified plaque. No significant stenosis.  2. The patient's total coronary artery calcium score is 524, which is 82 percentile for patient's matched age and gender.  3.  Left coronary artery dominance.  4.  Nodular pleural thickening associated with a pleural plaque in the right lower lobe.  This is incompletely imaged.  Recommend non emergent CT of the thorax for further evaluation and to serve as a baseline for follow up.  Report was called to CDU mid level at [phone] at 17 10 on 11/11/2011.  Original Report Authenticated By: Genevive Bi, M.D.   2. Dg Chest Portable 1 View 11/11/2011  *RADIOLOGY REPORT*  Clinical Data: Chest tightness.  PORTABLE CHEST - 1 VIEW  Comparison: None.  Findings: Trachea is midline.  Heart size normal.  Biapical pleural parenchymal scarring, right greater than left.  Mild bibasilar atelectasis, left greater than right.  No pleural fluid.  IMPRESSION: Low lung volumes with bibasilar atelectasis.  Original Report Authenticated By: Reyes Ivan, M.D.    EKG: Sinus bradycardia 56bpm. Nonspecific ST upsloping in I, V2, otherwise no acute changes.  Physical Exam: Blood pressure 134/81, pulse 50, temperature 98.2 F (36.8 C), temperature source Oral, resp. rate 18, height 5\' 6"  (1.676 m), weight 133 lb (60.328 kg), SpO2 100.00%. General: Well developed, well nourished, in no acute distress. Head: Normocephalic, atraumatic, sclera non-icteric, no xanthomas, nares are without discharge.  Neck: Negative for carotid bruits. JVD not elevated. Lungs: Clear bilaterally to auscultation without wheezes, rales, or rhonchi. Breathing is unlabored. Heart: RRR with S1 S2. No murmurs, rubs, or gallops appreciated. Abdomen: Soft, non-tender, non-distended with normoactive bowel sounds. No hepatomegaly. No rebound/guarding. No obvious abdominal masses. Msk:   Strength and tone appear normal for age. Extremities: No clubbing or cyanosis. No edema.  Distal pedal pulses are 2+ and equal bilaterally. Neuro: Alert and oriented X 3. Moves all extremities spontaneously. Psych:  Responds to questions appropriately with a normal affect.   ASSESSMENT AND PLAN:   1. Chest pain with abnormal cardiac CT - his chest pain is somewhat atypical in nature but has extensive calcification on CT. It is nonobstructive but ca++ can obscure the true vascular luminal dimensions. After extensive discussion with Dr. Daleen Squibb, we will  admit him and plan for stress myoview in the AM to evaluate physiologic significance of calcified vessels on CTA. At the very least he will need risk factor modification including treatment of lipids. Hold off on BB given baseline bradycardia. Add aspirin. He is already quite active and has a good diet and was encouraged to continue. Would try to avoid NTG given hypotension in ER with it.  2. History of hyperlipidemia - will add statin and check lipid panel.  3. Myasthenia gravis - patient was due to start new IVIG therapy this Friday but read that chest tightness was a potential complication. He is concerned about clarifying his cardiac status before starting IVIG. Continue mestinon qid.  4. Nodular pleural thickening associated with a pleural plaque in RLL on cardiac CT - would recommend outpatient f/u CT.   5. Regular EtOH use  - patient drinks 2 glasses of wine daily which is in line with max AHA recommendations. He has not had any trouble with cessation of alcohol and I believe him. He was counseled that cutting down may be beneficial in light of other medical problems and expressed that he was already thinking about backing off. Will monitor, consider adding CIWA protocol if he shows any agitation or signs of withdrawal.  Signed, Dayna Dunn PA-C 11/11/2011, 7:43 PM  I have taken a history, reviewed medications, allergies, PMH, SH, FH, and  reviewed ROS and examined the patient.  I agree with the assessment and plan. Discussed at length with patient, wife and daughter. Will check FLP then start atorvastatin..he has beeon this before. He will also need to go home on ASA. If he has any ischemia on perfusion study he will need a cath.  Ahlam Piscitelli C. Daleen Squibb, MD, [facility] [employer] [facility] Pager:  [phone]

## 2011-11-11 NOTE — ED Notes (Signed)
Pr given one sublingual nitro tablet. approx 5 minutes later pt noted to be diaphoretic and complained of lightheadedness. Pt BP dropped to 63/39. Bolus of NS started on pt. Pt began improving and bp is now up to 128/64.

## 2011-11-11 NOTE — ED Notes (Signed)
Reports waking up this am with mid chest tightness and "discomfort." denies any sob, dizziness, n/v. No acute distress noted at triage, ekg done.

## 2011-11-12 ENCOUNTER — Observation Stay (HOSPITAL_COMMUNITY): Payer: Medicare Other

## 2011-11-12 ENCOUNTER — Other Ambulatory Visit: Payer: Self-pay

## 2011-11-12 ENCOUNTER — Telehealth: Payer: Self-pay | Admitting: *Deleted

## 2011-11-12 DIAGNOSIS — R079 Chest pain, unspecified: Secondary | ICD-10-CM

## 2011-11-12 DIAGNOSIS — M810 Age-related osteoporosis without current pathological fracture: Secondary | ICD-10-CM | POA: Insufficient documentation

## 2011-11-12 DIAGNOSIS — E785 Hyperlipidemia, unspecified: Secondary | ICD-10-CM | POA: Insufficient documentation

## 2011-11-12 DIAGNOSIS — G7000 Myasthenia gravis without (acute) exacerbation: Secondary | ICD-10-CM | POA: Insufficient documentation

## 2011-11-12 DIAGNOSIS — R911 Solitary pulmonary nodule: Secondary | ICD-10-CM

## 2011-11-12 LAB — LIPID PANEL
HDL: 92 mg/dL (ref >39)
Triglycerides: 90 mg/dL (ref <150)

## 2011-11-12 LAB — CBC
Hemoglobin: 14.7 g/dL (ref 13.0–17.0)
MCHC: 33.3 g/dL (ref 30.0–36.0)
RDW: 13.4 % (ref 11.5–15.5)
WBC: 6.1 10*3/uL (ref 4.0–10.5)

## 2011-11-12 LAB — BASIC METABOLIC PANEL
Chloride: 107 mEq/L (ref 96–112)
GFR calc Af Amer: >90 mL/min (ref >90)
GFR calc non Af Amer: >90 mL/min (ref >90)
Potassium: 3.6 mEq/L (ref 3.5–5.1)
Sodium: 142 mEq/L (ref 135–145)

## 2011-11-12 LAB — CARDIAC PANEL(CRET KIN+CKTOT+MB+TROPI)
Relative Index: 2.1 (ref 0.0–2.5)
Total CK: 112 U/L (ref 7–232)
Troponin I: <0.3 ng/mL (ref <0.30)

## 2011-11-12 MED ORDER — TECHNETIUM TC 99M TETROFOSMIN IV KIT
30.0000 | PACK | Freq: Once | INTRAVENOUS | Status: AC | PRN
  Administered 2011-11-12: 30 via INTRAVENOUS

## 2011-11-12 MED ORDER — ATORVASTATIN CALCIUM 40 MG PO TABS: 40.0000 mg | ORAL_TABLET | Freq: Every day | ORAL | Status: DC

## 2011-11-12 MED ORDER — ASPIRIN 81 MG PO TBEC: 81.0000 mg | DELAYED_RELEASE_TABLET | Freq: Every day | ORAL | Status: AC

## 2011-11-12 MED ORDER — TECHNETIUM TC 99M TETROFOSMIN IV KIT
10.0000 | PACK | Freq: Once | INTRAVENOUS | Status: AC | PRN
  Administered 2011-11-12: 10 via INTRAVENOUS

## 2011-11-12 NOTE — Discharge Summary (Signed)
Patient ID: Sean Rosario,  MRN: [id number], DOB/AGE: Jun 12, 1946 66 y.o.  Admit date: 11/11/2011 Discharge date: 11/12/2011  Primary Care Provider: Mickie Hillier, MD Primary Cardiologist: T. Daisia Slomski, MD  Discharge Diagnoses Principal Problem:  *Chest pain at rest Active Problems:  Myasthenia gravis  Osteoporosis  Hyperlipidemia   Allergies No Known Allergies  Procedures  Cardiac CT Angiography 11/11/2011  CORONARY ARTERIES: Left main coronary artery: Short vessel of normal caliber. There is an ecentric calcified plaque which is nonobstructive. Left anterior descending artery: There are two proximal eccentric calcified plaques. The second plaque demonstrates between 25 and 50% luminal narrowing. There is a prominent diagonal branch which also contains eccentric calcified plaque which is nonobstructive. Left circumflex: A large vessel with an early branching obtuse marginal. There is a small eccentric plaque within the proximal obtuse marginal. Right coronary artery: The right coronary is diminutive. There are several eccentric calcified plaque  Posterior descending artery: Normal caliber with nonobstructed calcified plaque Dominance: Left CORONARY CALCIUM: Total Agatston Score: 524 MESA database percentile: 38  EXTRACARDIAC FINDINGS: There is soft tissue thickening associated with a pleural plaque within the posterior right lower lobe. This is incompletely imaged. The imaged portion measures 35 x 13 mm (image #1). Mild bronchiectasis at the lung bases.  Recommend non emergent CT of the thorax for further evaluation and to serve as a baseline for follow up. ____________________________  Exercise Myoview 11/12/2011  Findings: The patient exercised 12 minutes reaching > 85% MPHR. There were nonspecific changes on the ECG, no chest pain.  Perfusion images showed no perfusion defect at rest or with stress.  Gated images showed normal Azarel Banner motion, EF 68%  IMPRESSION: Normal  stress myoview with no evidence for ischemia or infarction. Good exercise tolerance.  History of Present Illness  66 y/o male with prior history as outlined above who was in his usoh until the morning of admission when he had sudden onset of sscp that awoke him from sleep.  He had no associated Ss.  He presented to the [facility] ED where he was treated with asa and ntg and became hypotensive and diaphoretic following ntg.  Cardiac enzymes were negative and he underwent Coronary CT angio revealing presumably nonobstructive LAD and diagonal disease with an elevated calcium score and non-cardiac findings as outlined above.  Cardiology was consulted and pt was admitted for further diagnostic testing.  Hospital Course  Pt had no further chest pain.  He underwent an exercise myoview this am during which he showed excellent exercise tolerance.  Perfusion imaging showed no evidence for ischemia or infarct with normal LV function.  He will be discharged home today in good condition.  We will arrange for dedicated  outpt chest CT to follow-up pleural findings on cardiac CTA.  Discharge Vitals Blood pressure 130/78, pulse 70, temperature 98.3 F (36.8 C), temperature source Oral, resp. rate 18, height 5\' 6"  (1.676 m), weight 133 lb (60.328 kg), SpO2 98.00%.  Filed Weights   11/11/11 1517  Weight: 133 lb (60.328 kg)    Labs  CBC  Basename 11/12/11 0700 11/11/11 0923  WBC 6.1 8.3  NEUTROABS -- 6.5  HGB 14.7 14.8  HCT 44.1 42.8  MCV 93.8 92.2  PLT 242 247   Basic Metabolic Panel  Basename 11/12/11 0700 11/11/11 0923  NA 142 140  K 3.6 3.6  CL 107 105  CO2 24 25  GLUCOSE 95 96  BUN 13 17  CREATININE 0.79 0.76  CALCIUM 9.4 9.6  MG -- --  PHOS -- --  Liver Function Tests  [facility] 11/11/11 0923  AST 27  ALT 27  ALKPHOS 67  BILITOT 0.6  PROT 7.3  ALBUMIN 4.0   Cardiac Enzymes  Basename 11/11/11 2315  CKTOTAL 112  CKMB 2.3  CKMBINDEX --  TROPONINI <0.30   D-Dimer  Basename  11/11/11 1026  DDIMER 0.29   Fasting Lipid Panel  Basename 11/12/11 0700  CHOL 265*  HDL 92  LDLCALC 155*  TRIG 90  CHOLHDL 2.9  LDLDIRECT --   Disposition  Pt is being discharged home today in good condition.  Follow-up Plans & Appointments  Follow-up Information    Follow up with Mickie Hillier, MD. (as scheduled)       Follow up with Valera Castle, MD on 01/04/2012. (9:30 AM)    Contact information:   [address] [place] [address] [phone]                **Follow-up Lipids/LFT's @ this visit**      Discharge Medications  Medication List  As of 11/12/2011  4:03 PM   TAKE these medications         alendronate 40 MG tablet   Commonly known as: FOSAMAX   Take 40 mg by mouth every 7 (seven) days. Take with a full glass of water on an empty stomach.      aspirin 81 MG EC tablet   Take 1 tablet (81 mg total) by mouth daily.      atorvastatin 40 MG tablet   Commonly known as: LIPITOR   Take 1 tablet (40 mg total) by mouth daily at 6 PM.      Fish Oil 1200 MG Caps   Take 1 capsule by mouth daily.      multivitamins ther. w/minerals Tabs   Take 1 tablet by mouth daily.      pyridostigmine 60 MG tablet   Commonly known as: MESTINON   Take 60 mg by mouth See admin instructions. Patient takes 4 to 5 times per day, depends on whether he has double vision or any other symptoms.      VITAMIN D PO   Take 1 tablet by mouth daily.            Outstanding Labs/Studies  Follow-up CT of the Chest (which we are arranging) & lipids/lft's in 6-8 wks  Duration of Discharge Encounter   Greater than 30 minutes including physician time.  Signed, Nicolasa Ducking NP 11/12/2011, 4:03 PM  I have taken a history, reviewed medications, allergies  I agree with the assessment and plan.  Magie Ciampa C. Daleen Squibb, MD, [facility] Pager:  [phone]

## 2011-11-12 NOTE — Progress Notes (Signed)
Patient rested comfortable through the night. Nurse administered heparin subQ  To patient. Patient had no episode of chest pain. Sean Rosario

## 2011-11-12 NOTE — Progress Notes (Signed)
Pt. Discharged 11/12/2011  5:11 PM Discharge instructions reviewed with patient/family. Patient/family verbalized understanding. All Rx's given. Questions answered as needed. Pt. Discharged to home with family/self. Walked off unit with wife, refused wheelchair. [address] Filter

## 2011-11-12 NOTE — Telephone Encounter (Signed)
Call received from Dr. Daleen Squibb. The patient is being discharged today. He had a cardiac CT done that showed a possible nodule. Per Dr. Daleen Squibb, the patient needs a non- contrast chest CT to evaluate the nodule further. We will call the patient at home to schedule.

## 2011-11-12 NOTE — Discharge Instructions (Signed)
***  PLEASE REMEMBER TO BRING ALL OF YOUR MEDICATIONS TO EACH OF YOUR FOLLOW-UP OFFICE VISITS.  

## 2011-11-12 NOTE — Progress Notes (Signed)
Cardiology Progress Note Patient Name: Sean Rosario Date of Encounter: 11/12/2011, 6:34 AM     Subjective  No overnight events. Patient without any further chest pain. Denies sob or palpitations. Reports that he feels dehydrated, but otherwise well and ready for stress test.   Objective   Telemetry: Sinus bradycardia 50s-60s  Medications: . acetaminophen  1,000 mg Oral To Major  . aspirin  324 mg Oral Once  . aspirin EC  81 mg Oral Daily  . atorvastatin  20 mg Oral q1800  . cholecalciferol  1,000 Units Oral Daily  . heparin  5,000 Units Subcutaneous Q8H  . metoprolol tartrate  50 mg Oral Once  . mulitivitamin with minerals  1 tablet Oral Daily  . nitroGLYCERIN  0.4 mg Sublingual Once  . omega-3 acid ethyl esters  1 g Oral Daily  . sodium chloride  3 mL Intravenous Q12H    Physical Exam: Temp:  [97.7 F (36.5 C)-98.8 F (37.1 C)] 97.8 F (36.6 C) (02/28 0454) Pulse Rate:  [50-64] 58  (02/28 0454) Resp:  [16-22] 18  (02/28 0454) BP: (107-145)/(64-82) 121/73 mmHg (02/28 0454) SpO2:  [96 %-100 %] 97 % (02/28 0454) Weight:  [133 lb (60.328 kg)] 133 lb (60.328 kg) (02/27 1517)  General: Well developed, well nourished white male, in no acute distress. Head: Normocephalic, atraumatic, sclera non-icteric, nares are without discharge.  Neck: Supple. Negative for carotid bruits or JVD Lungs: Clear bilaterally to auscultation without wheezes, rales, or rhonchi. Breathing is unlabored. Heart: Bradycardic, regular rhythm S1 S2 without murmurs, rubs, or gallops.  Abdomen: Soft, non-tender, non-distended with normoactive bowel sounds. No rebound/guarding. No obvious abdominal masses. Msk:  Strength and tone appear normal for age. Extremities: No edema. No clubbing or cyanosis. Distal pedal pulses are intact and equal bilaterally. Neuro: Alert and oriented X 3. Moves all extremities spontaneously. Psych:  Responds to questions appropriately with a normal affect.   Intake/Output  Summary (Last 24 hours) at 11/12/11 0634 Last data filed at 11/11/11 2328  Gross per 24 hour  Intake      3 ml  Output      0 ml  Net      3 ml    Labs:  [facility] 11/11/11 0923  NA 140  K 3.6  CL 105  CO2 25  GLUCOSE 96  BUN 17  CREATININE 0.76  CALCIUM 9.6   Basename 11/11/11 0923  AST 27  ALT 27  ALKPHOS 67  BILITOT 0.6  PROT 7.3  ALBUMIN 4.0    Basename 11/11/11 0923  WBC 8.3  NEUTROABS 6.5  HGB 14.8  HCT 42.8  MCV 92.2  PLT 247    11/11/2011 09:46 11/11/2011 13:48  Troponin i, poc 0.00 0.00   Basename 11/11/11 2315  CKTOTAL 112  CKMB 2.3  TROPONINI <0.30   Basename 11/11/11 1026  DDIMER 0.29   Radiology/Studies:   11/11/2011 - Cardiac CT CORONARY ARTERIES: Left main coronary artery: Short vessel of normal caliber. There is an ecentric calcified plaque which is nonobstructive. Left anterior descending artery: There are two proximal eccentric calcified plaques. The second plaque demonstrates between 25 and 50% luminal narrowing. There is a prominent diagonal branch which also contains eccentric calcified plaque which is nonobstructive. Left circumflex: A large vessel with an early branching obtuse marginal. There is a small eccentric plaque within the proximal obtuse marginal. Right coronary artery: The right coronary is diminutive. There are several eccentric calcified plaque  Posterior descending artery:  Normal caliber with nonobstructed calcified plaque Dominance: Left CORONARY CALCIUM: Total Agatston Score: 524 MESA database percentile: 82  FINDINGS: Technical quality:  Good  Heart rate:  51  CARDIAC MEASUREMENTS: Interventricular septum (6 - 12 mm): LV posterior Stillman Buenger (6 - 12 mm): LV diameter in diastole (35 - 52 mm):  AORTA AND PULMONARY MEASUREMENTS: Aortic root (21 - 40 mm):             30 mm  at the annulus             39 mm  at the sinuses of Valsalva             28 mm  at the sinotubular junction Ascending aorta ( <  40 mm):  31 mm Descending  aorta ( <  40 mm):  There is a 25 mm Main pulmonary artery:  ( <  30 mm):  24 mm  EXTRACARDIAC FINDINGS: There is soft tissue thickening associated with a pleural plaque within the posterior right lower lobe. This is incompletely imaged.  The imaged portion measures 35 x 13 mm (image #1).  Mild bronchiectasis at the lung bases.  Limited view of the upper abdomen is unremarkable.  View of the skeleton is unremarkable.  IMPRESSION:  1.  Extensive three-vessel eccentric calcified plaque. No significant stenosis.  2. The patient's total coronary artery calcium score is 524, which is 82 percentile for patient's matched age and gender.  3.  Left coronary artery dominance.  4.  Nodular pleural thickening associated with a pleural plaque in the right lower lobe.  This is incompletely imaged.  Recommend non emergent CT of the thorax for further evaluation and to serve as a baseline for follow up.   11/11/2011 - CXR  Findings: Trachea is midline.  Heart size normal.  Biapical pleural parenchymal scarring, right greater than left.  Mild bibasilar atelectasis, left greater than right.  No pleural fluid.  IMPRESSION: Low lung volumes with bibasilar atelectasis.     Assessment and Plan  66 y/o very active M with hx of pericarditis, HL, myasthenia gravis presented to [facility] on 11/11/11 with complaints of chest pain.   1. Chest pain with abnormal cardiac CT - his chest pain is somewhat atypical in nature but has extensive calcification on CT. It is nonobstructive but ca++ can obscure the true vascular luminal dimensions. He will go for stress myoview today to evaluate physiologic significance of calcified vessels on CTA. He will need risk factor modification including treatment of lipids. Atorvastatin initiated, with Lipid panel pending. Hold off on BB given baseline bradycardia. Cont aspirin. He is already quite active and has a good diet and was encouraged to continue. Would try to avoid NTG given hypotension  in ER with it. He has had not further chest pain. Cardiac enzymes negative x3 and EKG this am without acute ischemic changes.  2. History of hyperlipidemia - Cont atorvastatin and f/u lipid panel.   3. Myasthenia gravis - patient was due to start new IVIG therapy this Friday but read that chest tightness was a potential complication. He is concerned about clarifying his cardiac status before starting IVIG. Continue mestinon qid.   4. Nodular pleural thickening associated with a pleural plaque in RLL on cardiac CT - would recommend outpatient f/u CT.   5. Regular EtOH use - patient drinks 2 glasses of wine daily which is in line with max AHA recommendations. He states he has not had any trouble with cessation of  alcohol. He was counseled that cutting down may be beneficial in light of other medical problems and expressed that he was already thinking about backing off. Will monitor, consider adding CIWA protocol if he shows any agitation or signs of withdrawal.   Signed, HOPE, JESSICA PA-C  I have taken a history, reviewed medications, allergies, PMH, SH, FH, and reviewed ROS and examined the patient.  I agree with the assessment and plan.  Yuleni Burich C. Daleen Squibb, MD, [facility] Pager:  [phone]

## 2011-11-12 NOTE — ED Provider Notes (Signed)
See CDU note   Ward Givens, MD 11/12/11 [phone]

## 2011-11-13 ENCOUNTER — Telehealth: Payer: Self-pay | Admitting: Cardiology

## 2011-11-13 NOTE — Telephone Encounter (Signed)
[facility] appt for CT is 11/16/11 at 10:30 Mylo Red RN

## 2011-11-13 NOTE — Telephone Encounter (Signed)
Fu call °Patient returning your call °

## 2011-11-16 ENCOUNTER — Inpatient Hospital Stay: Admission: RE | Admit: 2011-11-16 | Payer: Medicare Other | Source: Ambulatory Visit

## 2011-11-16 ENCOUNTER — Encounter: Payer: Self-pay | Admitting: Cardiology

## 2011-11-18 ENCOUNTER — Telehealth: Payer: Self-pay | Admitting: Cardiology

## 2011-11-18 ENCOUNTER — Telehealth: Payer: Self-pay | Admitting: *Deleted

## 2011-11-18 NOTE — Telephone Encounter (Signed)
Notified pt that Dr. Daleen Squibb did review his CT Chest from 10-21-11@[facility]  and no reason to repeat CT Chest@this  time

## 2011-11-18 NOTE — Telephone Encounter (Signed)
Pt aware that CT scan from previous was stable and did not need to be repeated at this time. His results had been previously discussed with him by neurology Mylo Red RN

## 2012-01-04 ENCOUNTER — Encounter: Payer: Medicare Other | Admitting: Cardiology

## 2012-01-13 ENCOUNTER — Ambulatory Visit (INDEPENDENT_AMBULATORY_CARE_PROVIDER_SITE_OTHER): Payer: Medicare Other | Admitting: Cardiology

## 2012-01-13 ENCOUNTER — Encounter: Payer: Self-pay | Admitting: Cardiology

## 2012-01-13 VITALS — BP 110/78 | HR 50 | Ht 66.0 in | Wt 132.0 lb

## 2012-01-13 DIAGNOSIS — E785 Hyperlipidemia, unspecified: Secondary | ICD-10-CM

## 2012-01-13 DIAGNOSIS — I2510 Atherosclerotic heart disease of native coronary artery without angina pectoris: Secondary | ICD-10-CM | POA: Insufficient documentation

## 2012-01-13 MED ORDER — NITROGLYCERIN 0.4 MG SL SUBL: 0.4000 mg | SUBLINGUAL_TABLET | SUBLINGUAL | Status: DC | PRN

## 2012-01-13 MED ORDER — NITROGLYCERIN 0.4 MG SL SUBL: 0.4000 mg | SUBLINGUAL_TABLET | SUBLINGUAL | Status: AC | PRN

## 2012-01-13 NOTE — Patient Instructions (Signed)
Your physician wants you to follow-up in: 1 year with Dr. Wall. You will receive a reminder letter in the mail two months in advance. If you don't receive a letter, please call our office to schedule the follow-up appointment.  Your physician recommends that you continue on your current medications as directed. Please refer to the Current Medication list given to you today.  

## 2012-01-13 NOTE — Progress Notes (Signed)
HPI Mr. Sean Rosario comes in today for close followup of his recent diagnosis of nonobstructive coronary disease. He came to emergency room with atypical chest pain and had a cardiac CT because of risk factors. This showed nonobstructive plaque around 25-50%. Some of the plaque was eccentric. His EKG was unremarkable.  He has a history of hyperlipidemia with LDLs in the 160 range. He been on a statin in the past but stopped it. We advised him to start atorvastatin as well as an aspirin. He is now compliant with this.  He is having no angina or ischemic symptoms. He exercises on a regular basis. He is given a take a trip to [place].  Past Medical History  Diagnosis Date  . Pericarditis     20 years ago  . Myasthenia gravis     Only manifested by intermittent double vision. Maintained on mestinon  . Osteoporosis     Secondary to prednisone from myasthenia gravis for several years  . Hyperlipidemia     Patient previously elected lifestyle changes in lieu of meds    Current Outpatient Prescriptions  Medication Sig Dispense Refill  . alendronate (FOSAMAX) 40 MG tablet Take 40 mg by mouth every 7 (seven) days. Take with a full glass of water on an empty stomach.      Marland Kitchen aspirin EC 81 MG EC tablet Take 1 tablet (81 mg total) by mouth daily.      Marland Kitchen atorvastatin (LIPITOR) 40 MG tablet Take 1 tablet (40 mg total) by mouth daily at 6 PM.  30 tablet  6  . Cholecalciferol (VITAMIN D PO) Take 1 tablet by mouth daily.      . Multiple Vitamins-Minerals (MULTIVITAMINS THER. W/MINERALS) TABS Take 1 tablet by mouth daily.      . Omega-3 Fatty Acids (FISH OIL) 1200 MG CAPS Take 1 capsule by mouth daily.      Marland Kitchen pyridostigmine (MESTINON) 60 MG tablet Take 60 mg by mouth See admin instructions. Patient takes 3 or 4 times a day      . nitroGLYCERIN (NITROSTAT) 0.4 MG SL tablet Place 1 tablet (0.4 mg total) under the tongue every 5 (five) minutes as needed for chest pain.  25 tablet  11    No Known  Allergies  Family History  Problem Relation Age of Onset  . ALS Father   . Heart defect Father     2 valve surgeries  . Hypertension Mother   . Hypertension Sister     History   Social History  . Marital Status: Married    Spouse Name: N/A    Number of Children: N/A  . Years of Education: N/A   Occupational History  . Not on file.   Social History Main Topics  . Smoking status: Never Smoker   . Smokeless tobacco: Not on file  . Alcohol Use: 6.0 oz/week    10 Glasses of wine per week     2 glasses of wine per night most, but not all nights  . Drug Use: No  . Sexually Active: Not on file   Other Topics Concern  . Not on file   Social History Narrative  . No narrative on file    ROS ALL NEGATIVE EXCEPT THOSE NOTED IN HPI  PE  General Appearance: well developed, well nourished in no acute distress HEENT: symmetrical face, PERRLA, good dentition  Neck: no JVD, thyromegaly, or adenopathy, trachea midline Chest: symmetric without deformity Cardiac: PMI non-displaced, RRR, normal S1, S2, no gallop  or murmur Lung: clear to ausculation and percussion Vascular: all pulses full without bruits  Abdominal: nondistended, nontender, good bowel sounds, no HSM, no bruits Extremities: no cyanosis, clubbing or edema, no sign of DVT, no varicosities  Skin: normal color, no rashes Neuro: alert and oriented x 3, non-focal Pysch: normal affect  EKG  BMET    Component Value Date/Time   NA 142 11/12/2011 0700   K 3.6 11/12/2011 0700   CL 107 11/12/2011 0700   CO2 24 11/12/2011 0700   GLUCOSE 95 11/12/2011 0700   BUN 13 11/12/2011 0700   CREATININE 0.79 11/12/2011 0700   CALCIUM 9.4 11/12/2011 0700   GFRNONAA >90 11/12/2011 0700   GFRAA >90 11/12/2011 0700    Lipid Panel     Component Value Date/Time   CHOL 265* 11/12/2011 0700   TRIG 90 11/12/2011 0700   HDL 92 11/12/2011 0700   CHOLHDL 2.9 11/12/2011 0700   VLDL 18 11/12/2011 0700   LDLCALC 155* 11/12/2011 0700    CBC     Component Value Date/Time   WBC 6.1 11/12/2011 0700   RBC 4.70 11/12/2011 0700   HGB 14.7 11/12/2011 0700   HCT 44.1 11/12/2011 0700   PLT 242 11/12/2011 0700   MCV 93.8 11/12/2011 0700   MCH 31.3 11/12/2011 0700   MCHC 33.3 11/12/2011 0700   RDW 13.4 11/12/2011 0700   LYMPHSABS 1.0 11/11/2011 0923   MONOABS 0.7 11/11/2011 0923   EOSABS 0.1 11/11/2011 0923   BASOSABS 0.0 11/11/2011 1610

## 2012-01-13 NOTE — Assessment & Plan Note (Signed)
Findings the cardiac CT reviewed. We talked about importance of the anti-inflammatory nature of statins stabilizing plaque and also the importance of taking aspirin daily. We gave him a prescription for sublingual nitroglycerin and talked about acute coronary syndromes and how to respond. I will see him back in a year or when necessary. He will have his lipids checked by Dr. Clarene Duke on his annual visit.

## 2012-06-04 ENCOUNTER — Other Ambulatory Visit: Payer: Self-pay | Admitting: Nurse Practitioner

## 2012-06-27 IMAGING — CT CT HEART MORP W/ CTA COR W/ SCORE W/ CA W/CM &/OR W/O CM
2 of 6 series · 11 of 20 positions shown, 12 images · IV contrast (CONTRAST)
Comparison: Chest radiograph 11/11/2011

INDICATION: Chest pain.  Hyperlipidemia.

CT ANGIOGRAPHY OF THE HEART, CORONARY ARTERY, STRUCTURE, AND
MORPHOLOGY
CONTRAST:  80 ml Omnipaque
TECHNIQUE: CT angiography of the coronary vessels was performed on
a 256 channel system using prospective ECG gating.  A scout and
noncontrast exam (for calcium scoring) were performed.  Circulation
time was measured using a test bolus.  Coronary CTA was performed
with sub mm slice collimation during portions of the cardiac cycle
after prior injection of iodinated contrast.  Imaging post
processing was performed on an independent workstation creating
multiplanar and 3-D images, and quantitative analysis of the heart
and coronary arteries.  Note that this exam targets the heart and
the chest was not imaged in its entirety.
PREMEDICATION:
Lopressor 50 mg, P.O.
Nitroglycerin 400 mcg, sublingual.

[Series 3: calc score thin · axial · 0.49mm/px · z∈[-224,-105]mm · 3 of 96 slices shown, 4 images]
[im 1/96  vessel]
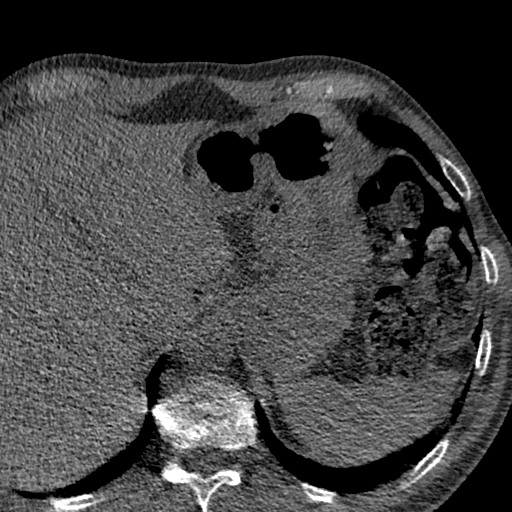
[im 1/96  lung]
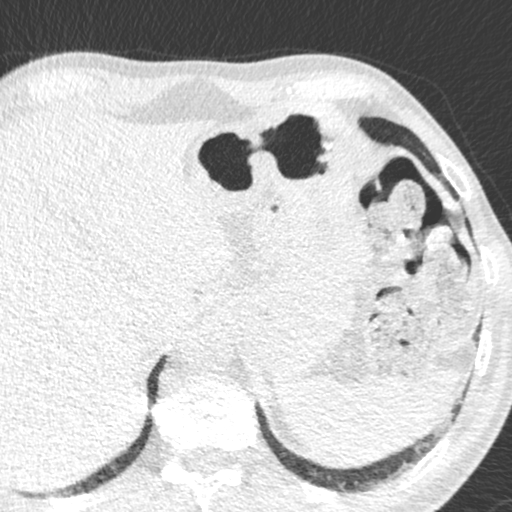
[im 48/96  vessel]
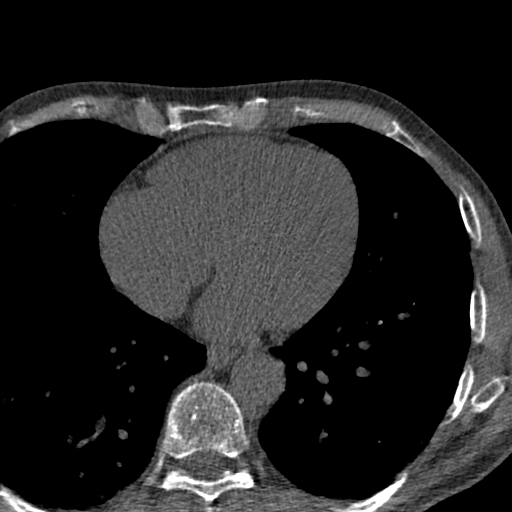
[im 96/96  vessel]
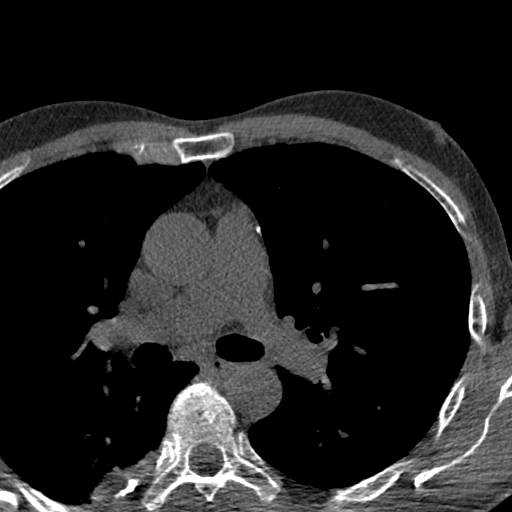

[Series 8: w/ edge cor., 78.0% · axial · 0.49mm/px · z∈[-215,-116]mm · 8 of 276 slices shown]
[im 28/276  lung]
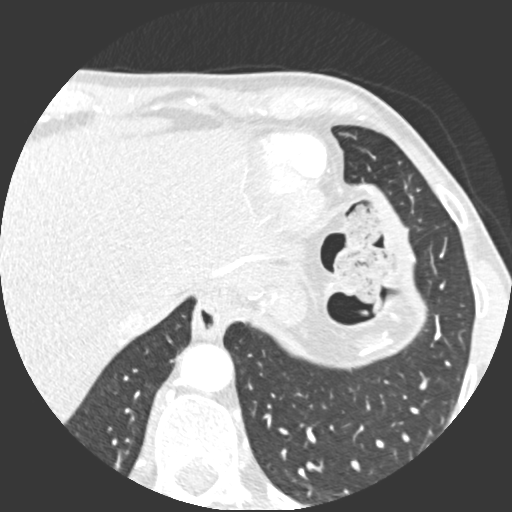
[im 56/276  lung]
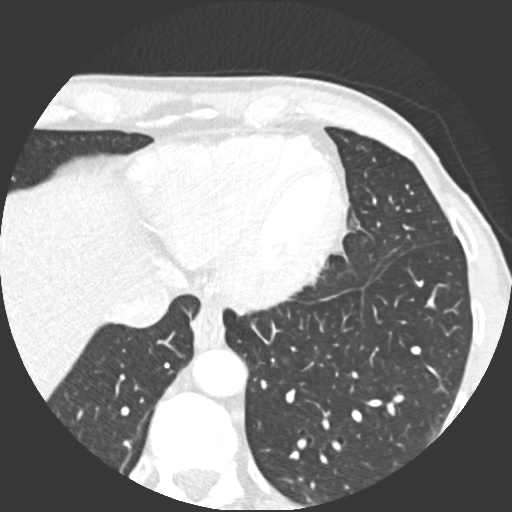
[im 83/276  lung]
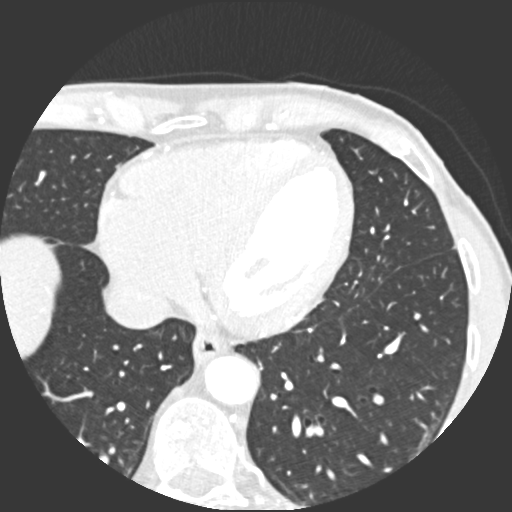
[im 111/276  lung]
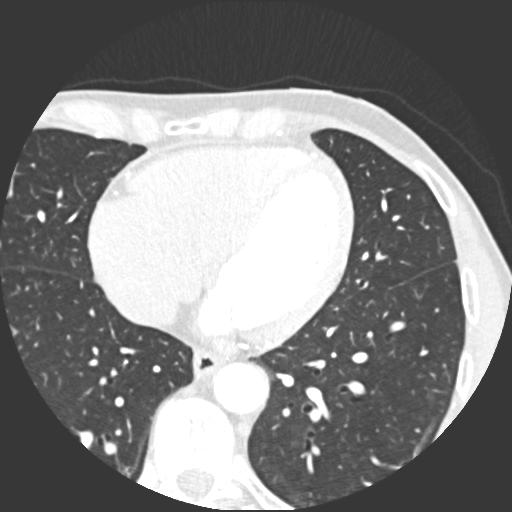
[im 166/276  lung]
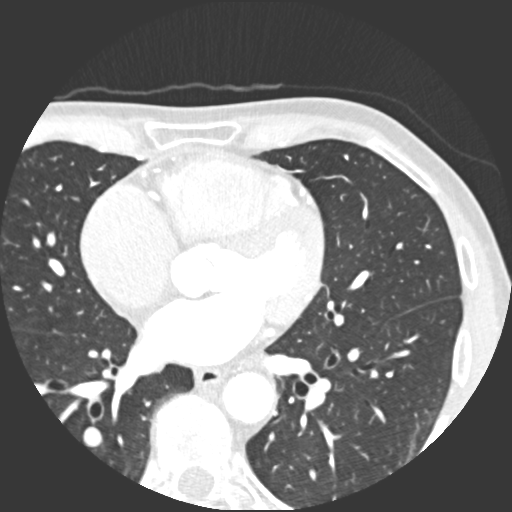
[im 193/276  lung]
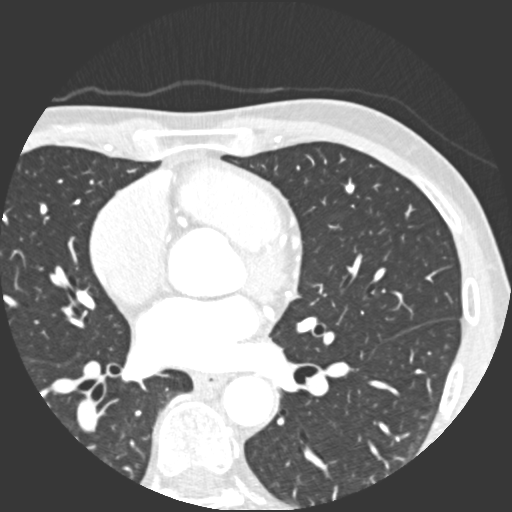
[im 221/276  lung]
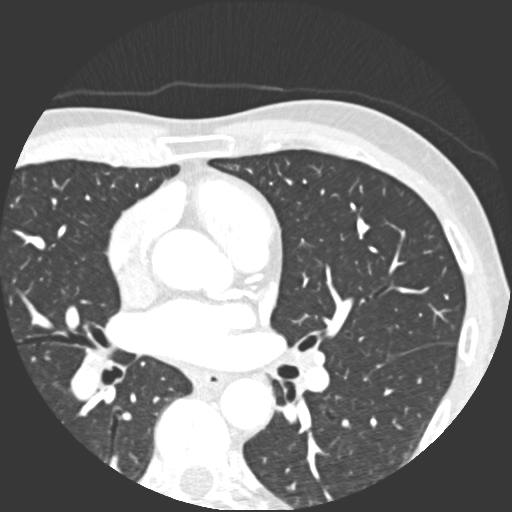
[im 248/276  lung]
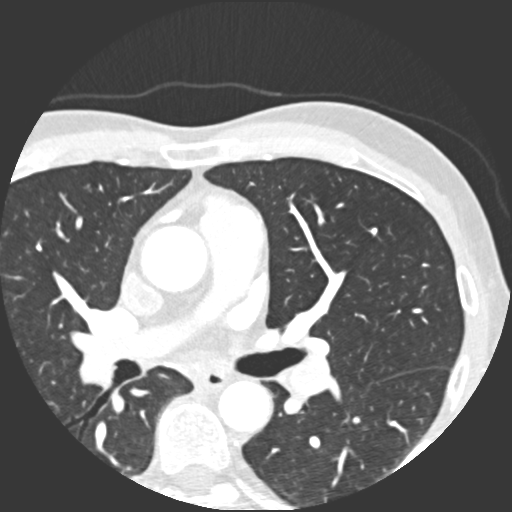

[11 of 20 positions shown; findings below may reference images not displayed]

FINDINGS: Technical quality:  Good

Heart rate:  51

CORONARY ARTERIES:
Left main coronary artery:  Short vessel  of normal caliber.  There
is an ecentric calcified plaque which is nonobstructive.

Left anterior descending artery: There are two proximal eccentric
calcified plaques.  The second plaque demonstrates between 25 and
50% luminal narrowing.  There is a prominent diagonal branch which
also contains eccentric calcified plaque which is nonobstructive..

Left circumflex:  A large vessel with an early branching obtuse
marginal.  There is a small eccentric plaque within the proximal
obtuse marginal.

Right coronary artery:  The right coronary is diminutive.  There
are several eccentric calcified plaque
Posterior descending artery:  Normal caliber with nonobstructed
calcified plaque
Dominance:  Left

CORONARY CALCIUM:
Total Agatston Score:  524
[HOSPITAL] percentile:  82

CARDIAC MEASUREMENTS:
Interventricular septum (6 - 12 mm):
LV posterior wall (6 - 12 mm):
LV diameter in diastole (35 - 52 mm):

AORTA AND PULMONARY MEASUREMENTS:
Aortic root (21 - 40 mm):
            30 mm  at the annulus
            39 mm  at the sinuses of Valsalva
            28 mm  at the sinotubular junction
Ascending aorta ( <  40 mm):  31 mm
Descending aorta ( <  40 mm):  There is a 25 mm
Main pulmonary artery:  ( <  30 mm):  24 mm

EXTRACARDIAC FINDINGS: There is soft tissue thickening associated
with a pleural plaque within the posterior right lower lobe. This
is incompletely imaged.  The imaged portion measures 35 x 13 mm
(image #1).  Mild bronchiectasis at the lung bases.

Limited view of the upper abdomen is unremarkable.

View of the skeleton is unremarkable.
IMPRESSION: 1.  Extensive three-vessel eccentric calcified plaque. No
significant stenosis.

2. The patient's total coronary artery calcium score is 524, which
is 82 percentile for patient's matched age and gender.

3.  Left coronary artery dominance.

4.  Nodular pleural thickening associated with a pleural plaque in
the right lower lobe.  This is incompletely imaged.  Recommend non
emergent CT of the thorax for further evaluation and to serve as a
baseline for follow up.

11/11/2011.

## 2012-08-24 ENCOUNTER — Other Ambulatory Visit: Payer: Self-pay | Admitting: Dermatology

## 2012-11-17 ENCOUNTER — Other Ambulatory Visit: Payer: Self-pay | Admitting: *Deleted

## 2012-11-17 MED ORDER — ATORVASTATIN CALCIUM 40 MG PO TABS: ORAL_TABLET | ORAL | Status: DC

## 2013-03-22 ENCOUNTER — Other Ambulatory Visit: Payer: Self-pay | Admitting: *Deleted

## 2013-03-22 MED ORDER — ATORVASTATIN CALCIUM 40 MG PO TABS: ORAL_TABLET | ORAL | Status: DC

## 2013-03-22 NOTE — Telephone Encounter (Signed)
Patient was advised to call and make an appointment for further refills. He is over due for his follow up appointment. He verbally agreed and said he will call back at a later time. I let him know that after this refill if he does not schedule an appointment he must refill thru PCP or schedule an appointment for further refills.    Sean Rosario [employer]

## 2013-08-21 ENCOUNTER — Encounter: Payer: Self-pay | Admitting: Cardiovascular Disease

## 2013-08-21 ENCOUNTER — Ambulatory Visit (INDEPENDENT_AMBULATORY_CARE_PROVIDER_SITE_OTHER): Payer: Medicare Other | Admitting: Cardiovascular Disease

## 2013-08-21 VITALS — BP 122/76 | HR 50 | Ht 65.0 in | Wt 133.0 lb

## 2013-08-21 DIAGNOSIS — E872 Acidosis, unspecified: Secondary | ICD-10-CM | POA: Insufficient documentation

## 2013-08-21 DIAGNOSIS — M79609 Pain in unspecified limb: Secondary | ICD-10-CM

## 2013-08-21 DIAGNOSIS — G7000 Myasthenia gravis without (acute) exacerbation: Secondary | ICD-10-CM

## 2013-08-21 DIAGNOSIS — M79606 Pain in leg, unspecified: Secondary | ICD-10-CM | POA: Insufficient documentation

## 2013-08-21 DIAGNOSIS — E785 Hyperlipidemia, unspecified: Secondary | ICD-10-CM

## 2013-08-21 DIAGNOSIS — I2510 Atherosclerotic heart disease of native coronary artery without angina pectoris: Secondary | ICD-10-CM

## 2013-08-21 NOTE — Patient Instructions (Addendum)
Your physician recommends that you schedule a follow-up appointment in:   3  MONTHS WITH DR [facility] Your physician recommends that you continue on your current medications as directed. Please refer to the Current Medication list given to you today.  You have been referred to  DR  TAT  THIS  WEEK OR  NEXT    DX  MYASTHENIA GRAVIS

## 2013-08-21 NOTE — Assessment & Plan Note (Signed)
No chest pain High calcium score  Continue statin and asa  F/U ETT in a year

## 2013-08-21 NOTE — Assessment & Plan Note (Signed)
Clearly does not appear toxic  Abdomen is benign No history of DM  No BME/HCO3, ABG to further evaluate.  Consider repeating in a week with BMET.  Peripheral venous sample may be falsely elevated when drawn in ambulatory setting

## 2013-08-21 NOTE — Assessment & Plan Note (Signed)
His pain would appear to have been muscular from overexertion.  His circulation has plus 4 pulses with no bruits and he dose not need ABI"s Or arterial duplex studies.  Has been on statin a long time and CPK is normal.  Mild lactic acidosis may support regional hypoperfusion of thigh musscles but unusual that CPK's would not have been elevated  More concern for more system manifestation of myesthenia as he had a lot of weakness.  Will refer to Dr Tat for further evaluation of his myasthenia which historically has been occular

## 2013-08-21 NOTE — Assessment & Plan Note (Signed)
Cholesterol is at goal.  Continue current dose of statin and diet Rx.  No myalgias or side effects.  F/U  LFT's in 6 months. Lab Results  Component Value Date   LDLCALC 155* 11/12/2011  LDL followed by Dr Sean Rosario above was off statin

## 2013-08-21 NOTE — Progress Notes (Signed)
Patient ID: Sean Rosario, male   DOB: [birth year], 67 y.o.   MRN: [id number] 67 yo previously seen by Dr Daleen Squibb  Had cardiac CT in 2013  Cardiac CT Angiography 11/11/2011  CORONARY ARTERIES: Left main coronary artery: Short vessel of normal caliber. There is an ecentric calcified plaque which is nonobstructive. Left anterior descending artery: There are two proximal eccentric calcified plaques. The second plaque demonstrates between 25 and 50% luminal narrowing. There is a prominent diagonal branch which also contains eccentric calcified plaque which is nonobstructive. Left circumflex: A large vessel with an early branching obtuse marginal. There is a small eccentric plaque within the proximal obtuse marginal. Right coronary artery: The right coronary is diminutive. There are several eccentric calcified plaque  Posterior descending artery: Normal caliber with nonobstructed calcified plaque Dominance: Left CORONARY CALCIUM: Total Agatston Score: 524 MESA database percentile: 82  F/U myovue normal no ischemia EF 68%   Carotids less than 50% bilateral stenosis  2/13 LDL was 155  Was on statins for a while then stopped Back on now Has never had leg pains or myalgias Indicates that LDL has been great.  Has occular myesthenia Episodes of blurry vision On mestinon.  Denies leg weakness or more systemic symptoms.  Last week had severe pains in both legs.  Had been working out a lot and doing yard work.    Labs checked by Dr Ihor Dow showed  Normal CPK Normal ESR , CRP normal  Lactic Acid was elevated 21.4 (upper normal 19.8)  Rested for 3 days and legs feel fine now  No fever, dizzyness signs of infection no history of DM or ketoacidosis.        ROS: Denies fever, malais, weight loss, blurry vision, decreased visual acuity, cough, sputum, SOB, hemoptysis, pleuritic pain, palpitaitons, heartburn, abdominal pain, melena, lower extremity edema, claudication, or rash.  All other systems reviewed and  negative  General: Affect appropriate Healthy:  appears stated age HEENT: normal Neck supple with no adenopathy JVP normal no bruits no thyromegaly Lungs clear with no wheezing and good diaphragmatic motion Heart:  S1/S2 no murmur, no rub, gallop or click PMI normal Abdomen: benighn, BS positve, no tenderness, no AAA no bruit.  No HSM or HJR Distal pulses intact with no bruits No edema Neuro non-focal Skin warm and dry No muscular weakness   Current Outpatient Prescriptions  Medication Sig Dispense Refill  . alendronate (FOSAMAX) 40 MG tablet Take 40 mg by mouth every 7 (seven) days. Take with a full glass of water on an empty stomach.      Marland Kitchen atorvastatin (LIPITOR) 20 MG tablet Take 20 mg by mouth daily.      . Cholecalciferol (VITAMIN D PO) Take 1 tablet by mouth daily.      . Multiple Vitamins-Minerals (MULTIVITAMINS THER. W/MINERALS) TABS Take 1 tablet by mouth daily.      . nitroGLYCERIN (NITROSTAT) 0.4 MG SL tablet Place 1 tablet (0.4 mg total) under the tongue every 5 (five) minutes as needed for chest pain.  25 tablet  11  . Omega-3 Fatty Acids (FISH OIL) 1200 MG CAPS Take 1 capsule by mouth daily.      Marland Kitchen pyridostigmine (MESTINON) 60 MG tablet Take 60 mg by mouth daily.        No current facility-administered medications for this visit.    Allergies  Review of patient's allergies indicates no known allergies.  Electrocardiogram:  NSR rate 50 normal   Assessment and Plan    ROS: Denies fever,  malais, weight loss, blurry vision, decreased visual acuity, cough, sputum, SOB, hemoptysis, pleuritic pain, palpitaitons, heartburn, abdominal pain, melena, lower extremity edema, claudication, or rash.  All other systems reviewed and negative  General: Affect appropriate Healthy:  appears stated age HEENT: normal Neck supple with no adenopathy JVP normal no bruits no thyromegaly Lungs clear with no wheezing and good diaphragmatic motion Heart:  S1/S2 no murmur, no rub,  gallop or click PMI normal Abdomen: benighn, BS positve, no tenderness, no AAA no bruit.  No HSM or HJR Distal pulses intact with no bruits  Popliteal DP/PT plus 4 bilaterally No edema Neuro non-focal Skin warm and dry No muscular weakness   Current Outpatient Prescriptions  Medication Sig Dispense Refill  . alendronate (FOSAMAX) 40 MG tablet Take 40 mg by mouth every 7 (seven) days. Take with a full glass of water on an empty stomach.      Marland Kitchen atorvastatin (LIPITOR) 20 MG tablet Take 20 mg by mouth daily.      . Cholecalciferol (VITAMIN D PO) Take 1 tablet by mouth daily.      . Multiple Vitamins-Minerals (MULTIVITAMINS THER. W/MINERALS) TABS Take 1 tablet by mouth daily.      . nitroGLYCERIN (NITROSTAT) 0.4 MG SL tablet Place 1 tablet (0.4 mg total) under the tongue every 5 (five) minutes as needed for chest pain.  25 tablet  11  . Omega-3 Fatty Acids (FISH OIL) 1200 MG CAPS Take 1 capsule by mouth daily.      Marland Kitchen pyridostigmine (MESTINON) 60 MG tablet Take 60 mg by mouth daily.        No current facility-administered medications for this visit.    Allergies  Review of patient's allergies indicates no known allergies.  Electrocardiogram:  NSR normal ECG   Assessment and Plan

## 2013-08-21 NOTE — Assessment & Plan Note (Signed)
Concern the leg weakness may be from myasthenia and disease is not strictly occular  F/U Dr Arbutus Leas and continue [employer]

## 2013-08-23 ENCOUNTER — Encounter: Payer: Medicare Other | Admitting: Cardiovascular Disease

## 2013-08-24 ENCOUNTER — Encounter: Payer: Medicare Other | Admitting: Cardiovascular Disease

## 2013-08-25 ENCOUNTER — Ambulatory Visit: Payer: Medicare Other | Admitting: Neurology

## 2013-10-06 ENCOUNTER — Encounter: Payer: Self-pay | Admitting: Neurology

## 2013-10-06 ENCOUNTER — Ambulatory Visit (INDEPENDENT_AMBULATORY_CARE_PROVIDER_SITE_OTHER): Payer: Medicare HMO | Admitting: Neurology

## 2013-10-06 VITALS — BP 108/62 | HR 60 | Resp 20 | Ht 65.0 in | Wt 134.4 lb

## 2013-10-06 DIAGNOSIS — G7000 Myasthenia gravis without (acute) exacerbation: Secondary | ICD-10-CM | POA: Insufficient documentation

## 2013-10-06 NOTE — Progress Notes (Signed)
Sean Rosario   Date: 10/06/2013   Sean Rosario MRN: [id number] DOB: 04/24/1946   Sean Rosario:  Thank you for your kind referral of Sean Rosario for consultation of myasthenia gravis. Although he history is well known to you, please allow [place] to reiterate it for the purpose of our medical record. The patient was accompanied to the clinic by self.    History of Present Illness: Sean Rosario is a 68 y.o. right-handed Caucasian male with history of ocular myasthenia (diagnosed in 2010, on mestinon 60mg  daily), osteoporesis, and hyperlipidemia presenting for evaluation of blurry vision.    Symptoms started in 2009 with diplopia and was referred to [facility] where he was evaluated by Sean Rosario who performed single fiber EMG that was mildly abnormal. He does not recall that his antibodies were positive.  He was diagnosed with ocular myasthenia in 2010 and was started on mestinon with no benefit.  He was started on prednisone 10mg  and increased to 20mg  for one year and diplopia resolved.  He was doing well until 2013, when his diplopia returned.  Due to osteoporosis, prednisone was not started, but a trial of IVIG was given without benefit.  He was started on mestinon 60mg  daily which resolved symptoms.    He is doing well on mestinon 60mg .  Since the fall of 2014, he has early morning symptoms of blurry vision when he wakes up, which lasts about 30-minutes.  There is no double vision.  His most recent eye evaluation was in the summer of 2014.  He has never been hospitalized for myasthenia or showed signs of dysarthria, dysphagia, or limb weakness.   He was previously seeing Sean Rosario at [facility], but would like to establish care here since it is closer for him to travel.  Out-side paper records, electronic medical record, and images have been reviewed where available and summarized as:  Lab Results  Component Value Date   TSH 1.744 11/12/2011   CT chest 10/20/2014: no evidence of thymoma  CT head 05/09/2008:  No acute intracranial abnormality.  US carotids 05/09/2008: No significant carotid stenosis identified by duplex ultrasound. There is a mild amount of plaque present bilaterally. Estimated ICA stenoses are less than 50% bilaterally. Incidental cystic nodules in both lobes of the thyroid gland which appears small and are likely benign.   Past Medical History  Diagnosis Date  . Pericarditis     20 years ago  . Myasthenia gravis     Only manifested by intermittent double vision. Maintained on mestinon  . Osteoporosis     Secondary to prednisone from myasthenia gravis for several years  . Hyperlipidemia     Patient previously elected lifestyle changes in lieu of meds    Past Surgical History  Procedure Laterality Date  . Inguinal hernia repair Right   . Tonsillectomy       Medications:  Current Outpatient Prescriptions on File Prior to Rosario  Medication Sig Dispense Refill  . alendronate (FOSAMAX) 40 MG tablet Take 40 mg by mouth every 7 (seven) days. Take with a full glass of water on an empty stomach.      Marland Kitchen atorvastatin (LIPITOR) 20 MG tablet Take 20 mg by mouth daily.      . Cholecalciferol (VITAMIN D PO) Take 1 tablet by mouth daily.      . Omega-3 Fatty Acids (FISH OIL) 1200 MG CAPS Take 1 capsule by mouth daily.      Marland Kitchen  pyridostigmine (MESTINON) 60 MG tablet Take 60 mg by mouth daily.       . nitroGLYCERIN (NITROSTAT) 0.4 MG SL tablet Place 1 tablet (0.4 mg total) under the tongue every 5 (five) minutes as needed for chest pain.  25 tablet  11   No current facility-administered medications on file prior to Rosario.    Allergies: No Known Allergies  Family History: Family History  Problem Relation Age of Onset  . ALS Father     Deceased, 38  . Heart defect Father     2 valve surgeries  . Hypertension Mother   . Hypertension Sister   . Epilepsy Sister     Social History: History    Social History  . Marital Status: Married    Spouse Name: N/A    Number of Children: N/A  . Years of Education: N/A   Occupational History  . Not on file.   Social History Main Topics  . Smoking status: Never Smoker   . Smokeless tobacco: Not on file  . Alcohol Use: 6.0 oz/week    10 Glasses of wine per week     Comment: 2 glasses of wine per night most, but not all nights  . Drug Use: No  . Sexual Activity: Not on file   Other Topics Concern  . Not on file   Social History Narrative   He is retired from [employer].   Lives with wife and mother (29).  They have two children.    Review of Systems:  CONSTITUTIONAL: No fevers, chills, night sweats, or weight loss.   EYES: +visual changes or eye pain ENT: No hearing changes.  No history of nose bleeds.   RESPIRATORY: No cough, wheezing and shortness of breath.   CARDIOVASCULAR: Negative for chest pain, and palpitations.   GI: Negative for abdominal discomfort, blood in stools or black stools.  No recent change in bowel habits.   GU:  No history of incontinence.   MUSCLOSKELETAL: No history of joint pain or swelling.  No myalgias.   SKIN: Negative for lesions, rash, and itching.   HEMATOLOGY/ONCOLOGY: Negative for prolonged bleeding, bruising easily, and swollen nodes.  No history of cancer.   ENDOCRINE: Negative for cold or heat intolerance, polydipsia or goiter.   PSYCH:  No depression or anxiety symptoms.   NEURO: As Above.   Vital Signs:  BP 108/62  Pulse 60  Resp 20  Ht 5\' 5"  (1.651 m)  Wt 134 lb 7 oz (60.98 kg)  BMI 22.37 kg/m2   General Medical Exam:   General:  Well appearing, comfortable.   Eyes/ENT: see cranial nerve examination.   Neck: No masses appreciated.  Full range of motion without tenderness.  No carotid bruits. Respiratory:  Clear to auscultation, good air entry bilaterally.   Cardiac:  Regular rate and rhythm, no murmur.   GI:  Soft, non-tender, non-distended abdomen.  Bowel  sounds normal. No masses, organomegaly.   Back:  No pain to palpation of spinous processes.   Extremities:  No deformities, edema, or skin discoloration. Good capillary refill.   Skin:  Skin color, texture, turgor normal. No rashes or lesions.  Neurological Exam: MENTAL STATUS including orientation to time, place, person, recent and remote memory, attention span and concentration, language, and fund of knowledge is normal.  Speech is not dysarthric.  CRANIAL NERVES: II:  No visual field defects.  Unremarkable fundi.   III-IV-VI: Pupils equal round and reactive to light.  Normal conjugate, extra-ocular eye  movements in all directions of gaze.  No nystagmus.  Subtle left ptosis without worsening with sustained upgaze.   V:  Normal facial sensation.    VII:  Normal facial symmetry and movements.  Frontalis, orbicularis auris, orbicularis oculi, and buccinator muscles are 5/5. VIII:  Normal hearing and vestibular function.   IX-X:  Normal palatal movement.   XI:  Normal shoulder shrug and head rotation.   XII:  Normal tongue strength and range of motion, no deviation or fasciculation.  MOTOR:  No atrophy, fasciculations or abnormal movements.  No pronator drift.  Tone is normal.  There is no muscle fatigability  Right Upper Extremity:    Left Upper Extremity:    Deltoid  5/5   Deltoid  5/5   Biceps  5/5   Biceps  5/5   Triceps  5/5   Triceps  5/5   Wrist extensors  5/5   Wrist extensors  5/5   Wrist flexors  5/5   Wrist flexors  5/5   Finger extensors  5/5   Finger extensors  5/5   Finger flexors  5/5   Finger flexors  5/5   Dorsal interossei  5/5   Dorsal interossei  5/5   Abductor pollicis  5/5   Abductor pollicis  5/5   Tone (Ashworth scale)  0  Tone (Ashworth scale)  0   Right Lower Extremity:    Left Lower Extremity:    Hip flexors  5/5   Hip flexors  5/5   Hip extensors  5/5   Hip extensors  5/5   Knee flexors  5/5   Knee flexors  5/5   Knee extensors  5/5   Knee extensors  5/5    Dorsiflexors  5/5   Dorsiflexors  5/5   Plantarflexors  5/5   Plantarflexors  5/5   Toe extensors  5/5   Toe extensors  5/5   Toe flexors  5/5   Toe flexors  5/5   Tone (Ashworth scale)  0  Tone (Ashworth scale)  0   MSRs:  Right                                                                 Left brachioradialis 2+  brachioradialis 2+  biceps 2+  biceps 2+  triceps 2+  triceps 2+  patellar 2+  patellar 2+  ankle jerk 2+  ankle jerk 2+  Hoffman no  Hoffman no  plantar response down  plantar response down   SENSORY:  Normal and symmetric perception of light touch, pinprick, vibration, and proprioception.  Romberg's sign absent.   COORDINATION/GAIT: Normal finger-to- nose-finger and heel-to-shin.  Intact rapid alternating movements bilaterally.  Able to rise from a chair without using arms.  Gait narrow based and stable. Tandem and stressed gait intact.    IMPRESSION: Mr. Ulven is a 68 year-old with ocular myasthenia gravis, diagnosed in 2010, thymoma negative. His antibody status is unknown and I have requested his previous records.  I suspect that he is antibody negative since there was some confusion which his symptoms initially started.  Clinically, he is doing great.  He has morning symptoms which are transient and not bothersome enough to start maintenance therapy.  Further, due to his osteoporosis he is  not interested in prednisone. Going forward, I discussed that if his symptoms worsen her generalized he would need to be placed on immunomodulatory therapy (azathioprine or CellCept), but that these would take several months before having therapeutic benefit. Unfortunately, he is doing very well at this time on Mestinon 60 mg daily. Given that his symptoms have been isolated to ocular weakness and he is ~5 years from symptom onset, my suspicion that he will transition into generalized myasthenia is low.    PLAN/RECOMMENDATIONS:  1.  Continue mestinon 60mg  once daily 2.  Obtain  previous laboratory work from [facility] 3.  Return to clinic in 3-months  The duration of this appointment Rosario was 45 minutes of face-to-face time with the patient.  Greater than 50% of this time was spent in counseling, explanation of diagnosis, planning of further management, and coordination of care.   Thank you for allowing me to participate in patient's care.  If I can answer any additional questions, I would be pleased to do so.    Sincerely,    Donika K. Posey Pronto, DO

## 2013-10-06 NOTE — Patient Instructions (Addendum)
1.  Continue mestinon 60mg  once daily 2.  Obtain previous laboratory work from [facility] 3.  Return to clinic in 4-months

## 2013-10-11 ENCOUNTER — Telehealth: Payer: Self-pay | Admitting: Neurology

## 2013-10-11 NOTE — Telephone Encounter (Signed)
Labs received from [facility] dated 09/25/2011: Musk antibody titer negative, acetylcholine binding antibody (<0.03) - negative  Diara Chaudhari K. Posey Pronto, DO

## 2013-11-21 ENCOUNTER — Ambulatory Visit (INDEPENDENT_AMBULATORY_CARE_PROVIDER_SITE_OTHER): Payer: Commercial Managed Care - HMO | Admitting: Cardiovascular Disease

## 2013-11-21 ENCOUNTER — Encounter: Payer: Self-pay | Admitting: Cardiovascular Disease

## 2013-11-21 VITALS — BP 122/80 | HR 72 | Ht 65.0 in | Wt 135.8 lb

## 2013-11-21 DIAGNOSIS — I2510 Atherosclerotic heart disease of native coronary artery without angina pectoris: Secondary | ICD-10-CM

## 2013-11-21 DIAGNOSIS — E785 Hyperlipidemia, unspecified: Secondary | ICD-10-CM

## 2013-11-21 DIAGNOSIS — G7000 Myasthenia gravis without (acute) exacerbation: Secondary | ICD-10-CM

## 2013-11-21 NOTE — Patient Instructions (Signed)
Your physician wants you to follow-up in: YEAR WITH DR NISHAN  You will receive a reminder letter in the mail two months in advance. If you don't receive a letter, please call our office to schedule the follow-up appointment.  Your physician recommends that you continue on your current medications as directed. Please refer to the Current Medication list given to you today. 

## 2013-11-21 NOTE — Assessment & Plan Note (Signed)
Improved Seeing Dr Posey Pronto with [facility]  Continue current meds

## 2013-11-21 NOTE — Progress Notes (Signed)
Patient ID: Sean Rosario, male   DOB: [birth year], 68 y.o.   MRN: [id number] 68 yo previously seen by Dr Verl Blalock Had cardiac CT in 2013  Cardiac CT Angiography 11/11/2011  CORONARY ARTERIES: Left main coronary artery: Short vessel of normal caliber. There is an ecentric calcified plaque which is nonobstructive. Left anterior descending artery: There are two proximal eccentric calcified plaques. The second plaque demonstrates between 25 and 50% luminal narrowing. There is a prominent diagonal branch which also contains eccentric calcified plaque which is nonobstructive. Left circumflex: A large vessel with an early branching obtuse marginal. There is a small eccentric plaque within the proximal obtuse marginal. Right coronary artery: The right coronary is diminutive. There are several eccentric calcified plaque  Posterior descending artery: Normal caliber with nonobstructed calcified plaque Dominance: Left CORONARY CALCIUM: Total Agatston Score: 524 MESA database percentile: 82  F/U myovue normal no ischemia EF 68%  Carotids less than 50% bilateral stenosis  2/13 LDL was 155  Was on statins for a while then stopped Back on now Has never had leg pains or myalgias Indicates that LDL has been great. Has occular myesthenia Episodes of blurry vision  On mestinon. Denies leg weakness or more systemic symptoms. Last week had severe pains in both legs. Had been working out a lot and doing yard work.  Labs checked by Dr Orland Penman showed  Normal CPK Normal ESR , CRP normal Lactic Acid was elevated 21.4 (upper normal 19.8)  Rested for 3 days and legs feel fine now  No fever, dizzyness signs of infection no history of DM or ketoacidosis.       ROS: Denies fever, malais, weight loss, blurry vision, decreased visual acuity, cough, sputum, SOB, hemoptysis, pleuritic pain, palpitaitons, heartburn, abdominal pain, melena, lower extremity edema, claudication, or rash.  All other systems reviewed and  negative  General: Affect appropriate Healthy:  appears stated age 49: normal Neck supple with no adenopathy JVP normal no bruits no thyromegaly Lungs clear with no wheezing and good diaphragmatic motion Heart:  S1/S2 no murmur, no rub, gallop or click PMI normal Abdomen: benighn, BS positve, no tenderness, no AAA no bruit.  No HSM or HJR Distal pulses intact with no bruits No edema Neuro non-focal Skin warm and dry No muscular weakness   Current Outpatient Prescriptions  Medication Sig Dispense Refill  . alendronate (FOSAMAX) 40 MG tablet Take 40 mg by mouth every 7 (seven) days. Take with a full glass of water on an empty stomach.      Marland Kitchen aspirin 81 MG tablet Take 81 mg by mouth daily.      Marland Kitchen atorvastatin (LIPITOR) 20 MG tablet Take 20 mg by mouth daily.      . Cholecalciferol (VITAMIN D PO) Take 1 tablet by mouth daily.      . nitroGLYCERIN (NITROSTAT) 0.4 MG SL tablet Place 1 tablet (0.4 mg total) under the tongue every 5 (five) minutes as needed for chest pain.  25 tablet  11  . Omega-3 Fatty Acids (FISH OIL) 1200 MG CAPS Take 1 capsule by mouth daily.      . Probiotic Product (PROBIOTIC DAILY PO) Take by mouth daily.      Marland Kitchen pyridostigmine (MESTINON) 60 MG tablet Take 60 mg by mouth daily.       . TURMERIC PO Take by mouth.       No current facility-administered medications for this visit.    Allergies  Review of patient's allergies indicates no known allergies.  Electrocardiogram:  NSR normal ECG   Assessment and Plan

## 2013-11-21 NOTE — Assessment & Plan Note (Signed)
Cholesterol is at goal.  Continue current dose of statin and diet Rx.  No myalgias or side effects.  F/U  LFT's in 6 months. Lab Results  Component Value Date   LDLCALC 155* 11/12/2011  Labs checked by primary LDL under 100

## 2013-11-21 NOTE — Assessment & Plan Note (Signed)
Non obstructive No pain On ASA and statin  ETT next year

## 2014-04-27 ENCOUNTER — Ambulatory Visit: Payer: Medicare HMO | Admitting: Neurology

## 2014-05-01 ENCOUNTER — Ambulatory Visit: Payer: Medicare HMO | Admitting: Neurology

## 2014-05-07 ENCOUNTER — Ambulatory Visit: Payer: Medicare HMO | Admitting: Neurology

## 2014-05-16 ENCOUNTER — Encounter: Payer: Self-pay | Admitting: Neurology

## 2014-05-16 ENCOUNTER — Ambulatory Visit (INDEPENDENT_AMBULATORY_CARE_PROVIDER_SITE_OTHER): Payer: Commercial Managed Care - HMO | Admitting: Neurology

## 2014-05-16 VITALS — BP 108/68 | HR 59 | Ht 65.0 in | Wt 130.6 lb

## 2014-05-16 DIAGNOSIS — G7000 Myasthenia gravis without (acute) exacerbation: Secondary | ICD-10-CM

## 2014-05-16 NOTE — Progress Notes (Signed)
Follow-up Visit   Date: 05/16/2014    Sean Rosario MRN: [id number] DOB: 08/13/1946   Interim History: Sean Rosario is a 68 y.o. right-handed Caucasian male with history of ocular myasthenia (diagnosed in 2010, on mestinon 60mg  daily), osteoporesis, and hyperlipidemia returning for follow-up of ocular myasthenia gravis.  History of present illness: Symptoms started in 2009 with diplopia and was seen by Dr. Vallarie Mare at [facility] who performed single fiber EMG that was mildly abnormal. He does not recall that his antibodies were positive. He was diagnosed with ocular myasthenia in 2010 and was started on mestinon with no benefit.  Prednisone was slowly titrated to 20mg  for one year and diplopia resolved. He was doing well until 2013, when his diplopia returned. Due to osteoporosis, prednisone was not started, but a trial of IVIG was given without benefit. He was started on mestinon 60mg  daily which resolved symptoms.   He is doing well on mestinon 60mg . Since the fall of 2014, he has early morning symptoms of blurry vision when he wakes up, which lasts about 30-minutes. There is no double vision.  He has never been hospitalized for myasthenia or showed signs of dysarthria, dysphagia, or limb weakness.  He was previously seeing Dr. [name] at [facility], but due to distance transitioned care here in January 2015.   UPDATE 05/16/2014:  He continues to have early morning diplopia and difficulty reading small fonts, which lasts about 20-min, but he is still able to do all usual activities.  He is doing well on mestinon 60mg  daily with no worsening or new symptoms.  No interval hospitalizations, illnesses, falls.  Denies any shortness of breath, dysphagia, or limb weakness.    Medications:  Current Outpatient Prescriptions on File Prior to Visit  Medication Sig Dispense Refill  . alendronate (FOSAMAX) 40 MG tablet Take 40 mg by mouth every 7 (seven) days. Take with a full glass of  water on an empty stomach.      Marland Kitchen aspirin 81 MG tablet Take 81 mg by mouth daily.      Marland Kitchen atorvastatin (LIPITOR) 20 MG tablet Take 20 mg by mouth daily.      . Cholecalciferol (VITAMIN D PO) Take 1 tablet by mouth daily.      . nitroGLYCERIN (NITROSTAT) 0.4 MG SL tablet Place 1 tablet (0.4 mg total) under the tongue every 5 (five) minutes as needed for chest pain.  25 tablet  11  . Omega-3 Fatty Acids (FISH OIL) 1200 MG CAPS Take 1 capsule by mouth daily.      . Probiotic Product (PROBIOTIC DAILY PO) Take by mouth daily.      Marland Kitchen pyridostigmine (MESTINON) 60 MG tablet Take 60 mg by mouth daily.       . TURMERIC PO Take by mouth.       No current facility-administered medications on file prior to visit.    Allergies: No Known Allergies   Review of Systems:  CONSTITUTIONAL: No fevers, chills, night sweats, or weight loss.   EYES: No visual changes or eye pain ENT: No hearing changes.  No history of nose bleeds.   RESPIRATORY: No cough, wheezing and shortness of breath.   CARDIOVASCULAR: Negative for chest pain, and palpitations.   GI: Negative for abdominal discomfort, blood in stools or black stools.  No recent change in bowel habits.   GU:  No history of incontinence.   MUSCLOSKELETAL: No history of joint pain or swelling.  No myalgias.   SKIN: Negative for  lesions, rash, and itching.   ENDOCRINE: Negative for cold or heat intolerance, polydipsia or goiter.   PSYCH:  No depression or anxiety symptoms.   NEURO: As Above.   Vital Signs:  BP 108/68  Pulse 59  Ht 5\' 5"  (1.651 m)  Wt 130 lb 9 oz (59.223 kg)  BMI 21.73 kg/m2  SpO2 98%  Neurological Exam: MENTAL STATUS including orientation to time, place, person, recent and remote memory, attention span and concentration, language, and fund of knowledge is normal.  Speech is not dysarthric.  CRANIAL NERVES: Pupils equal round and reactive to light.  Normal conjugate, extra-ocular eye movements in all directions of gaze.  Subtle left  ptosis without worsening with sustained upgaze (old).  Face is symmetric with normal motor strength testing of facial muscles. Palate elevates symmetrically.  Tongue is midline.  MOTOR:  Motor strength is 5/5 in all extremities.  Tone is normal.    MSRs:  Reflexes are 2+/4 throughout.  COORDINATION/GAIT:  Gait narrow based and stable.   Data: Labs received from [facility] dated 09/25/2011: Musk antibody titer negative, acetylcholine binding antibody (<0.03) - negative  CT chest 10/20/2014: no evidence of thymoma  CT head 05/09/2008: No acute intracranial abnormality.   US carotids 05/09/2008: No significant carotid stenosis identified by duplex ultrasound. There is a mild amount of plaque present bilaterally. Estimated ICA stenoses are less than 50% bilaterally. Incidental cystic nodules in both lobes of the thyroid gland which appears small and are likely benign.   IMPRESSION: 1.  Ocular myasthenia gravis, seronegative, thymoma negative  - Diagnosed 2010 based on single fiber EMG, symptoms manifesting with diplopia  - Previously treated with prednisone 20mg  (stopped due to osteoporeosis) and IVIG (no benefit)  - Clinically doing great on mestinon 60mg  daily, only transient morning symptoms of diplopia,  - Due to his osteoporosis he is not interested in prednisone, which is reasonable as his symptoms are very mild.  Going forward, I discussed that if his symptoms worsen or generalized he would need to be placed on immunomodulatory therapy (azathioprine or CellCept), but that these would take several months before having therapeutic benefit.   Given that his symptoms have been isolated to ocular weakness and he is ~5 years from symptom onset, the likelihood he will transition into generalized myasthenia is low.    PLAN/RECOMMENDATIONS:  1. Continue mestinon 60mg  once daily   2. Return to clinic in 1-year or sooner as needed    The duration of this appointment visit was 20  minutes of face-to-face time with the patient.  Greater than 50% of this time was spent in counseling, explanation of diagnosis, planning of further management, and coordination of care.   Thank you for allowing me to participate in patient's care.  If I can answer any additional questions, I would be pleased to do so.    Sincerely,    Natayah Warmack K. Posey Pronto, DO

## 2014-05-16 NOTE — Progress Notes (Signed)
Note faxed.

## 2014-05-16 NOTE — Patient Instructions (Signed)
It was great to see you today. Continue taking mestinon 60mg  daily. Contact my office or send me a MyChart message when you need refills on mestinon. I will see you back in 1 year or sooner as needed

## 2014-07-02 ENCOUNTER — Telehealth: Payer: Self-pay | Admitting: Neurology

## 2014-07-02 NOTE — Telephone Encounter (Signed)
Patient called back to clarify Rx instructions.  Will fax  Rx to pharmacy.

## 2014-07-02 NOTE — Telephone Encounter (Signed)
Pt called/returning your call. C/B [phone]

## 2014-12-16 NOTE — Progress Notes (Signed)
Patient ID: Sean Rosario, male   DOB: 1946-06-24, 69 y.o.   MRN: [id number] 69 y.o.  previously seen by Dr Verl Blalock Had cardiac CT in 2013 reviewed  Cardiac CT Angiography 11/11/2011  CORONARY ARTERIES: Left main coronary artery: Short vessel of normal caliber. There is an ecentric calcified plaque which is nonobstructive. Left anterior descending artery: There are two proximal eccentric calcified plaques. The second plaque demonstrates between 25 and 50% luminal narrowing. There is a prominent diagonal branch which also contains eccentric calcified plaque which is nonobstructive. Left circumflex: A large vessel with an early branching obtuse marginal. There is a small eccentric plaque within the proximal obtuse marginal. Right coronary artery: The right coronary is diminutive. There are several eccentric calcified plaque  Posterior descending artery: Normal caliber with nonobstructed calcified plaque Dominance: Left CORONARY CALCIUM: Total Agatston Score: 524 MESA database percentile: 82   F/U myovue normal no ischemia EF 68%  Plan for ETT this year to follow known moderate CAD  Carotids less than 50% bilateral stenosis   Was on statins for a while then stopped Back on now Has never had leg pains or myalgias Indicates that LDL has been great.  Has occular myesthenia Episodes of blurry vision  On mestinon. Denies leg weakness or more systemic symptoms.Sees Patel   ROS: Denies fever, malais, weight loss, blurry vision, decreased visual acuity, cough, sputum, SOB, hemoptysis, pleuritic pain, palpitaitons, heartburn, abdominal pain, melena, lower extremity edema, claudication, or rash.  All other systems reviewed and negative  General: Affect appropriate Healthy:  appears stated age 24: normal Neck supple with no adenopathy JVP normal no bruits no thyromegaly Lungs clear with no wheezing and good diaphragmatic motion Heart:  S1/S2 no murmur, no rub, gallop or click PMI normal Abdomen:  benighn, BS positve, no tenderness, no AAA no bruit.  No HSM or HJR Distal pulses intact with no bruits No edema Neuro non-focal Skin warm and dry No muscular weakness   Current Outpatient Prescriptions  Medication Sig Dispense Refill  . alendronate (FOSAMAX) 40 MG tablet Take 40 mg by mouth every 7 (seven) days. Take with a full glass of water on an empty stomach.    Marland Kitchen aspirin 81 MG tablet Take 81 mg by mouth daily.    Marland Kitchen atorvastatin (LIPITOR) 20 MG tablet Take 20 mg by mouth daily.    . Cholecalciferol (VITAMIN D PO) Take 1 tablet by mouth daily.    . nitroGLYCERIN (NITROSTAT) 0.4 MG SL tablet Place 1 tablet (0.4 mg total) under the tongue every 5 (five) minutes as needed for chest pain. 25 tablet 11  . Omega-3 Fatty Acids (FISH OIL) 1200 MG CAPS Take 1 capsule by mouth daily.    . Probiotic Product (PROBIOTIC DAILY PO) Take by mouth daily.    Marland Kitchen pyridostigmine (MESTINON) 60 MG tablet Take 60 mg by mouth daily.      No current facility-administered medications for this visit.    Allergies  Review of patient's allergies indicates no known allergies.  Electrocardiogram: 11/21/13   NSR normal ECG  12/17/14  SB rate 47  Normal   Assessment and Plan CAD:  Known moderate 2VD given this and bradycardia will order ETT to r/o ischemia and chronotropic incompetence Bradycardia:  Asymptomatic  Not on AV nodal blocking drugs no AV block on ECG  ETT make sure HR response to exercise is normal Occular Myesthenia:  Continue mestinon no sign of systemic weakness stable  Chol:  Continue statin labs with primary Cholesterol is  at goal.  Continue current dose of statin and diet Rx.  No myalgias or side effects.  F/U  LFT's in 6 months. Lab Results  Component Value Date   LDLCALC 155* 11/12/2011  Osteoporosis:  Continue fosamax previous steroid use from eyes Does lots of weight bearing exercise

## 2014-12-17 ENCOUNTER — Ambulatory Visit (INDEPENDENT_AMBULATORY_CARE_PROVIDER_SITE_OTHER): Payer: PPO | Admitting: Cardiovascular Disease

## 2014-12-17 ENCOUNTER — Encounter: Payer: Self-pay | Admitting: Cardiovascular Disease

## 2014-12-17 VITALS — BP 120/70 | HR 47 | Ht 65.0 in | Wt 131.4 lb

## 2014-12-17 DIAGNOSIS — R001 Bradycardia, unspecified: Secondary | ICD-10-CM

## 2014-12-17 DIAGNOSIS — I2510 Atherosclerotic heart disease of native coronary artery without angina pectoris: Secondary | ICD-10-CM

## 2014-12-17 NOTE — Patient Instructions (Signed)
Your physician wants you to follow-up in: [place] will receive a reminder letter in the mail two months in advance. If you don't receive a letter, please call our office to schedule the follow-up appointment. Your physician recommends that you continue on your current medications as directed. Please refer to the Current Medication list given to you today.  Your physician has requested that you have an exercise tolerance test. For further information please visit [email or web address]. Please also follow instruction sheet, as given.

## 2015-02-05 ENCOUNTER — Encounter: Payer: PPO | Admitting: Nurse Practitioner

## 2015-03-20 ENCOUNTER — Ambulatory Visit (INDEPENDENT_AMBULATORY_CARE_PROVIDER_SITE_OTHER): Payer: PPO | Admitting: Physician Assistant

## 2015-03-20 DIAGNOSIS — I2510 Atherosclerotic heart disease of native coronary artery without angina pectoris: Secondary | ICD-10-CM | POA: Diagnosis not present

## 2015-03-20 DIAGNOSIS — R001 Bradycardia, unspecified: Secondary | ICD-10-CM

## 2015-03-20 LAB — EXERCISE TOLERANCE TEST
CHL RATE OF PERCEIVED EXERTION: 14
CSEPPHR: 131 {beats}/min
Estimated workload: 10.1 METS
Exercise duration (min): 9 min
MPHR: 150 {beats}/min
Percent HR: 87 %
Rest HR: 50 {beats}/min

## 2015-05-17 ENCOUNTER — Ambulatory Visit: Payer: PPO | Admitting: Neurology

## 2015-06-24 ENCOUNTER — Ambulatory Visit: Payer: PPO | Admitting: Neurology

## 2015-07-03 ENCOUNTER — Encounter: Payer: Self-pay | Admitting: Neurology

## 2015-07-03 ENCOUNTER — Ambulatory Visit (INDEPENDENT_AMBULATORY_CARE_PROVIDER_SITE_OTHER): Payer: PPO | Admitting: Neurology

## 2015-07-03 VITALS — BP 120/70 | HR 68 | Ht 65.0 in | Wt 132.2 lb

## 2015-07-03 DIAGNOSIS — G7000 Myasthenia gravis without (acute) exacerbation: Secondary | ICD-10-CM

## 2015-07-03 MED ORDER — PYRIDOSTIGMINE BROMIDE 60 MG PO TABS: 60.0000 mg | ORAL_TABLET | Freq: Two times a day (BID) | ORAL | Status: DC

## 2015-07-03 NOTE — Progress Notes (Signed)
Follow-up Visit   Date: 07/03/2015    Sean Rosario Rosario: [id number] DOB: 1946-06-15   Interim History: Sean Rosario is a 69 y.o. right-handed Caucasian male with history of ocular myasthenia (diagnosed in 2010, on mestinon 60mg  daily), osteoporesis, and hyperlipidemia returning for follow-up of ocular myasthenia gravis.  History of present illness: Symptoms started in 2009 with diplopia and was seen by Dr. Vallarie Mare at [facility] who performed single fiber EMG that was mildly abnormal. He does not recall that his antibodies were positive. He was diagnosed with ocular myasthenia in 2010 and was started on mestinon with no benefit.  Prednisone was slowly titrated to 20mg  for one year and diplopia resolved. He was doing well until 2013, when his diplopia returned. Due to osteoporosis, prednisone was not started, but a trial of IVIG was given without benefit. He was started on mestinon 60mg  daily which resolved symptoms.   He is doing well on mestinon 60mg . Since the fall of 2014, he has early morning symptoms of blurry vision when he wakes up, which lasts about 30-minutes. There is no double vision.  He has never been hospitalized for myasthenia or showed signs of dysarthria, dysphagia, or limb weakness.  He was previously seeing Dr. [name] at [facility], but due to distance transitioned care here in January 2015.   UPDATE 05/16/2014:  He continues to have early morning diplopia and difficulty reading small fonts, which lasts about 20-min, but he is still able to do all usual activities.  He is doing well on mestinon 60mg  daily with no worsening or new symptoms.   UPDATE 07/03/2015:   He reports noticing a little bit of double vision in the evenings and if he makes abrupt head movements, such as checking his blind spot.  He currently takes mestinon 60mg  daily at 7am only.  He has not taken greater dosage due to concerns of potential adverse effects.  He has not had problems of GI  upset, cramping, or diarrhea.   No interval hospitalizations, illnesses, falls.  Denies any shortness of breath, dysphagia, or limb weakness.   Medications:  Current Outpatient Prescriptions on File Prior to Visit  Medication Sig Dispense Refill  . alendronate (FOSAMAX) 40 MG tablet Take 40 mg by mouth every 7 (seven) days. Take with a full glass of water on an empty stomach.    Marland Kitchen aspirin 81 MG tablet Take 81 mg by mouth daily.    Marland Kitchen atorvastatin (LIPITOR) 20 MG tablet Take 20 mg by mouth daily.    . Cholecalciferol (VITAMIN D PO) Take 1 tablet by mouth daily.    . nitroGLYCERIN (NITROSTAT) 0.4 MG SL tablet Place 1 tablet (0.4 mg total) under the tongue every 5 (five) minutes as needed for chest pain. 25 tablet 11  . Omega-3 Fatty Acids (FISH OIL) 1200 MG CAPS Take 1 capsule by mouth daily.    . Probiotic Product (PROBIOTIC DAILY PO) Take by mouth daily.     No current facility-administered medications on file prior to visit.    Allergies: No Known Allergies   Review of Systems:  CONSTITUTIONAL: No fevers, chills, night sweats, or weight loss.   EYES: No visual changes or eye pain ENT: No hearing changes.  No history of nose bleeds.   RESPIRATORY: No cough, wheezing and shortness of breath.   CARDIOVASCULAR: Negative for chest pain, and palpitations.   GI: Negative for abdominal discomfort, blood in stools or black stools.  No recent change in bowel habits.  GU:  No history of incontinence.   MUSCLOSKELETAL: No history of joint pain or swelling.  No myalgias.   SKIN: Negative for lesions, rash, and itching.   ENDOCRINE: Negative for cold or heat intolerance, polydipsia or goiter.   PSYCH:  No depression or anxiety symptoms.   NEURO: As Above.   Vital Signs:  BP 120/70 mmHg  Pulse 68  Ht 5\' 5"  (1.651 m)  Wt 132 lb 4 oz (59.988 kg)  BMI 22.01 kg/m2  SpO2 92%  Neurological Exam: MENTAL STATUS including orientation to time, place, person, recent and remote memory, attention  span and concentration, language, and fund of knowledge is normal.  Speech is not dysarthric.  CRANIAL NERVES: Pupils equal round and reactive to light.  Normal conjugate, extra-ocular eye movements in all directions of gaze.  Subtle left ptosis without worsening with sustained upgaze (old).  Face is symmetric with normal motor strength testing of facial muscles. Palate elevates symmetrically.  Tongue is midline.  MOTOR:  Motor strength is 5/5 in all extremities.  Tone is normal.    MSRs:  Reflexes are 2+/4 throughout.  COORDINATION/GAIT:  Gait narrow based and stable.   Data: Labs received from [facility] dated 09/25/2011: Musk antibody titer negative, acetylcholine binding antibody (<0.03) - negative  CT chest 10/20/2014: no evidence of thymoma  CT head 05/09/2008: No acute intracranial abnormality.   US carotids 05/09/2008: No significant carotid stenosis identified by duplex ultrasound. There is a mild amount of plaque present bilaterally. Estimated ICA stenoses are less than 50% bilaterally. Incidental cystic nodules in both lobes of the thyroid gland which appears small and are likely benign.   IMPRESSION: Ocular myasthenia gravis, seronegative, thymoma negative  - Diagnosed 2010 based on single fiber EMG, symptoms manifesting with diplopia  - Previously treated with prednisone 20mg  (stopped due to osteoporeosis) and IVIG (no benefit)  - Increase mestinon to 60mg  twice daily given mild worsening of symptoms in the evening.  Refills provided.   - Due to his osteoporosis he is not interested in prednisone, which is reasonable as his symptoms are very mild.  Going forward, I discussed that if his symptoms worsen or generalized he would need to be placed on immunomodulatory therapy (azathioprine or CellCept), but that these would take several months before having therapeutic benefit.   Given that his symptoms have been isolated to ocular weakness and he is ~5 years from symptom  onset, the likelihood he will transition into generalized myasthenia is low.   Return to clinic in as needed    The duration of this appointment visit was 25 minutes of face-to-face time with the patient.  Greater than 50% of this time was spent in counseling, explanation of diagnosis, planning of further management, and coordination of care.   Thank you for allowing me to participate in patient's care.  If I can answer any additional questions, I would be pleased to do so.    Sincerely,    Donika K. Posey Pronto, DO

## 2015-07-03 NOTE — Progress Notes (Signed)
Note routed

## 2015-07-03 NOTE — Patient Instructions (Addendum)
1.  Increase mestinon 60mg  twice daily.  If you develop cramping or diarrhea, reduce to 30mg  in the evening (half-tab). 2.  Return to clinic as needed

## 2015-09-26 ENCOUNTER — Ambulatory Visit: Payer: PPO | Admitting: Neurology

## 2016-02-05 ENCOUNTER — Ambulatory Visit: Payer: PPO | Admitting: Cardiovascular Disease

## 2016-02-10 NOTE — Progress Notes (Signed)
Patient ID: Sean Rosario, male   DOB: October 20, 1945, 70 y.o.   MRN: [id number] 70 y.o.  previously seen by Dr Verl Blalock Had cardiac CT in 2013 reviewed  Cardiac CT Angiography 11/11/2011  CORONARY ARTERIES: Left main coronary artery: Short vessel of normal caliber. There is an ecentric calcified plaque which is nonobstructive. Left anterior descending artery: There are two proximal eccentric calcified plaques. The second plaque demonstrates between 25 and 50% luminal narrowing. There is a prominent diagonal branch which also contains eccentric calcified plaque which is nonobstructive. Left circumflex: A large vessel with an early branching obtuse marginal. There is a small eccentric plaque within the proximal obtuse marginal. Right coronary artery: The right coronary is diminutive. There are several eccentric calcified plaque  Posterior descending artery: Normal caliber with nonobstructed calcified plaque Dominance: Left CORONARY CALCIUM: Total Agatston Score: 524 MESA database percentile: 82   F/U myovue normal no ischemia EF 68%    Carotids less than 50% bilateral stenosis   Was on statins for a while then stopped Back on now Has never had leg pains or myalgias Indicates that LDL has been great.  Has occular myesthenia Episodes of blurry vision  On mestinon. Denies leg weakness or more systemic symptoms.Sees Sean Rosario  03/20/15 Had normal ETT with max HR 131 bpm  Looking forward to tip to [place] this month Had old nitro from 2013 and we discussed not needing to refill it  ROS: Denies fever, malais, weight loss, blurry vision, decreased visual acuity, cough, sputum, SOB, hemoptysis, pleuritic pain, palpitaitons, heartburn, abdominal pain, melena, lower extremity edema, claudication, or rash.  All other systems reviewed and negative  General: Affect appropriate Healthy:  appears stated age 8: normal Neck supple with no adenopathy JVP normal no bruits no  thyromegaly Lungs clear with no wheezing and good diaphragmatic motion Heart:  S1/S2 no murmur, no rub, gallop or click PMI normal Abdomen: benighn, BS positve, no tenderness, no AAA no bruit.  No HSM or HJR Distal pulses intact with no bruits No edema Neuro non-focal Skin warm and dry No muscular weakness   Current Outpatient Prescriptions  Medication Sig Dispense Refill  . alendronate (FOSAMAX) 40 MG tablet Take 40 mg by mouth every 7 (seven) days. Take with a full glass of water on an empty stomach.    Marland Kitchen aspirin 81 MG tablet Take 81 mg by mouth daily.    Marland Kitchen atorvastatin (LIPITOR) 20 MG tablet Take 20 mg by mouth daily.    . Cholecalciferol (VITAMIN D PO) Take 1 tablet by mouth daily.    . nitroGLYCERIN (NITROSTAT) 0.4 MG SL tablet Place 1 tablet (0.4 mg total) under the tongue every 5 (five) minutes as needed for chest pain. 25 tablet 11  . Omega-3 Fatty Acids (FISH OIL) 1200 MG CAPS Take 1 capsule by mouth daily.    . Probiotic Product (PROBIOTIC DAILY PO) Take 1 tablet by mouth daily.     Marland Kitchen pyridostigmine (MESTINON) 60 MG tablet Take 1 tablet (60 mg total) by mouth 2 (two) times daily. 180 tablet 3   No current facility-administered medications for this visit.    Allergies  Review of patient's allergies indicates no known allergies.  Electrocardiogram: 11/21/13   NSR normal ECG  12/17/14  SB rate 47  Normal  02/12/16 SR rate 56 normal   Assessment and Plan CAD:  Known moderate 2VD normal ETT 03/2015 continue medical Rx Bradycardia:  Asymptomatic  Not on AV nodal blocking drugs no AV  block on ECG  No chronotropic incomp ETT Occular Myesthenia:  Continue mestinon no sign of systemic weakness stable  Chol:  Continue statin labs with primary Cholesterol is at goal.  Continue current dose of statin and diet Rx.  No myalgias or side effects.  Labs with Dr Rex Kras next month   Lab Results  Component Value Date   LDLCALC 155* 11/12/2011  Osteoporosis:  Continue fosamax previous  steroid use from eyes Does lots of weight bearing exercise          Jenkins Rouge

## 2016-02-12 ENCOUNTER — Encounter: Payer: Self-pay | Admitting: Cardiovascular Disease

## 2016-02-12 ENCOUNTER — Ambulatory Visit (INDEPENDENT_AMBULATORY_CARE_PROVIDER_SITE_OTHER): Payer: PPO | Admitting: Cardiovascular Disease

## 2016-02-12 VITALS — BP 120/72 | HR 56 | Ht 65.0 in | Wt 130.0 lb

## 2016-02-12 DIAGNOSIS — I2510 Atherosclerotic heart disease of native coronary artery without angina pectoris: Secondary | ICD-10-CM

## 2016-02-12 NOTE — Patient Instructions (Addendum)

## 2016-03-27 DIAGNOSIS — H43813 Vitreous degeneration, bilateral: Secondary | ICD-10-CM | POA: Diagnosis not present

## 2016-03-27 DIAGNOSIS — H5213 Myopia, bilateral: Secondary | ICD-10-CM | POA: Diagnosis not present

## 2016-03-27 DIAGNOSIS — H524 Presbyopia: Secondary | ICD-10-CM | POA: Diagnosis not present

## 2016-03-27 DIAGNOSIS — H25813 Combined forms of age-related cataract, bilateral: Secondary | ICD-10-CM | POA: Diagnosis not present

## 2016-05-11 DIAGNOSIS — R0789 Other chest pain: Secondary | ICD-10-CM | POA: Diagnosis not present

## 2016-06-08 DIAGNOSIS — Z125 Encounter for screening for malignant neoplasm of prostate: Secondary | ICD-10-CM | POA: Diagnosis not present

## 2016-06-08 DIAGNOSIS — M810 Age-related osteoporosis without current pathological fracture: Secondary | ICD-10-CM | POA: Diagnosis not present

## 2016-06-08 DIAGNOSIS — Z79899 Other long term (current) drug therapy: Secondary | ICD-10-CM | POA: Diagnosis not present

## 2016-06-08 DIAGNOSIS — E7800 Pure hypercholesterolemia, unspecified: Secondary | ICD-10-CM | POA: Diagnosis not present

## 2016-06-08 DIAGNOSIS — R7301 Impaired fasting glucose: Secondary | ICD-10-CM | POA: Diagnosis not present

## 2016-06-10 DIAGNOSIS — I2510 Atherosclerotic heart disease of native coronary artery without angina pectoris: Secondary | ICD-10-CM | POA: Diagnosis not present

## 2016-06-10 DIAGNOSIS — Z79899 Other long term (current) drug therapy: Secondary | ICD-10-CM | POA: Diagnosis not present

## 2016-06-10 DIAGNOSIS — Z0000 Encounter for general adult medical examination without abnormal findings: Secondary | ICD-10-CM | POA: Diagnosis not present

## 2016-06-10 DIAGNOSIS — L570 Actinic keratosis: Secondary | ICD-10-CM | POA: Diagnosis not present

## 2016-06-10 DIAGNOSIS — E7800 Pure hypercholesterolemia, unspecified: Secondary | ICD-10-CM | POA: Diagnosis not present

## 2016-06-10 DIAGNOSIS — N400 Enlarged prostate without lower urinary tract symptoms: Secondary | ICD-10-CM | POA: Diagnosis not present

## 2016-06-10 DIAGNOSIS — M810 Age-related osteoporosis without current pathological fracture: Secondary | ICD-10-CM | POA: Diagnosis not present

## 2016-06-10 DIAGNOSIS — G7000 Myasthenia gravis without (acute) exacerbation: Secondary | ICD-10-CM | POA: Diagnosis not present

## 2016-07-08 DIAGNOSIS — M810 Age-related osteoporosis without current pathological fracture: Secondary | ICD-10-CM | POA: Diagnosis not present

## 2016-07-08 DIAGNOSIS — Z23 Encounter for immunization: Secondary | ICD-10-CM | POA: Diagnosis not present

## 2016-10-14 ENCOUNTER — Telehealth: Payer: Self-pay | Admitting: Neurology

## 2016-10-14 ENCOUNTER — Other Ambulatory Visit: Payer: Self-pay | Admitting: *Deleted

## 2016-10-14 ENCOUNTER — Other Ambulatory Visit: Payer: Self-pay | Admitting: Neurology

## 2016-10-14 NOTE — Telephone Encounter (Signed)
Sean Rosario [birth year]. He said that when he was last here in 2016 Doctor Posey Pronto told him he could have them filled on an as needed basis. He wasn't really needing them much at the time? He said if he does need to come in will he have enough until she can see him? His # is [id number]. Thank you

## 2016-10-14 NOTE — Telephone Encounter (Signed)
Sean Rosario made an appointment to come in for a follow up on 12/28/16.  He said he will need some to get him through until then. He uses [facility]. His # is [id number]. Thank you

## 2016-10-14 NOTE — Telephone Encounter (Signed)
Called pharmacy and gave vo for mestinon 3 month supply.

## 2016-10-14 NOTE — Telephone Encounter (Signed)
Requested for Brandy to call to schedule patient for a follow up appointment.

## 2016-10-14 NOTE — Telephone Encounter (Signed)
[phone] patient states he went to get a refill and the pharmacy states he needed to check with our office not sure why please call him back

## 2016-10-14 NOTE — Telephone Encounter (Signed)
I will send him in enough to get him through if he needs it.

## 2016-11-06 DIAGNOSIS — D225 Melanocytic nevi of trunk: Secondary | ICD-10-CM | POA: Diagnosis not present

## 2016-11-06 DIAGNOSIS — X32XXXD Exposure to sunlight, subsequent encounter: Secondary | ICD-10-CM | POA: Diagnosis not present

## 2016-11-06 DIAGNOSIS — L570 Actinic keratosis: Secondary | ICD-10-CM | POA: Diagnosis not present

## 2016-12-28 ENCOUNTER — Encounter: Payer: Self-pay | Admitting: Neurology

## 2016-12-28 ENCOUNTER — Ambulatory Visit (INDEPENDENT_AMBULATORY_CARE_PROVIDER_SITE_OTHER): Payer: PPO | Admitting: Neurology

## 2016-12-28 VITALS — BP 100/64 | HR 57 | Ht 65.0 in | Wt 135.3 lb

## 2016-12-28 DIAGNOSIS — M436 Torticollis: Secondary | ICD-10-CM | POA: Diagnosis not present

## 2016-12-28 DIAGNOSIS — G7000 Myasthenia gravis without (acute) exacerbation: Secondary | ICD-10-CM | POA: Diagnosis not present

## 2016-12-28 MED ORDER — PYRIDOSTIGMINE BROMIDE 60 MG PO TABS: 60.0000 mg | ORAL_TABLET | Freq: Two times a day (BID) | ORAL | 3 refills | Status: DC

## 2016-12-28 NOTE — Progress Notes (Signed)
Follow-up Visit   Date: 12/28/16    Sean Rosario MRN: [id number] DOB: [birth year]   Interim History: Sean Rosario is a 71 y.o. right-handed Caucasian male with history of ocular myasthenia (diagnosed in 2010, on mestinon 60mg  daily), osteoporesis, and hyperlipidemia returning for follow-up of ocular myasthenia gravis.  History of present illness: Symptoms started in 2009 with diplopia and was seen by Dr. Vallarie Mare at [facility] who performed single fiber EMG that was mildly abnormal. He does not recall that his antibodies were positive. He was diagnosed with ocular myasthenia in 2010 and was started on mestinon with no benefit.  Prednisone was slowly titrated to 20mg  for one year and diplopia resolved. He was doing well until 2013, when his diplopia returned. Due to osteoporosis, prednisone was not started, but a trial of IVIG was given without benefit. He was started on mestinon 60mg  daily which resolved symptoms.   He is doing well on mestinon 60mg . Since the fall of 2014, he has early morning symptoms of blurry vision when he wakes up, which lasts about 30-minutes. There is no double vision.  He has never been hospitalized for myasthenia or showed signs of dysarthria, dysphagia, or limb weakness.  He was previously seeing Dr. [name] at [facility], but due to distance transitioned care here in January 2015.  UPDATE 05/16/2014:  He continues to have early morning diplopia and difficulty reading small fonts, which lasts about 20-min, but he is still able to do all usual activities.  He is doing well on mestinon 60mg  daily with no worsening or new symptoms.   UPDATE 07/03/2015:   He reports noticing a little bit of double vision in the evenings and if he makes abrupt head movements, such as checking his blind spot.  He currently takes mestinon 60mg  daily at 7am only.  He has not taken greater dosage due to concerns of potential adverse effects.  He has not had problems of GI  upset, cramping, or diarrhea.   No interval hospitalizations, illnesses, falls.  Denies any shortness of breath, dysphagia, or limb weakness.  UPDATE 12/28/2016:  He denies any new changes with myasthenia gravis.  He continues to take mestinon 60mg  once daily and occasionally an extra dose as needed.  During long drives and especially in the evening, he tends to get blurry vision.  This is present even with closing each eye.  He may have very mild double vision, but it does not interfere with his usual activity. He has tried taking an extra dose of mestinon, but there is no improvement. He has annual eye exam which has been stable. No interval hospitalizations, falls, or illnesses. He also complains of neck stiffness and reduced range of motion especially with rotation.  There is no neck pain, arm weakness, or numbness tingling.  Medications:  Current Outpatient Prescriptions on File Prior to Visit  Medication Sig Dispense Refill  . alendronate (FOSAMAX) 40 MG tablet Take 40 mg by mouth every 7 (seven) days. Take with a full glass of water on an empty stomach.    Marland Kitchen aspirin 81 MG tablet Take 81 mg by mouth daily.    Marland Kitchen atorvastatin (LIPITOR) 20 MG tablet Take 20 mg by mouth daily.    . Cholecalciferol (VITAMIN D PO) Take 1 tablet by mouth daily.    . nitroGLYCERIN (NITROSTAT) 0.4 MG SL tablet Place 1 tablet (0.4 mg total) under the tongue every 5 (five) minutes as needed for chest pain. 25 tablet 11  .  Omega-3 Fatty Acids (FISH OIL) 1200 MG CAPS Take 1 capsule by mouth daily.    . Probiotic Product (PROBIOTIC DAILY PO) Take 1 tablet by mouth daily.     Marland Kitchen pyridostigmine (MESTINON) 60 MG tablet Take 1 tablet (60 mg total) by mouth 2 (two) times daily. 180 tablet 3   No current facility-administered medications on file prior to visit.     Allergies: No Known Allergies   Review of Systems:  CONSTITUTIONAL: No fevers, chills, night sweats, or weight loss.   EYES: No visual changes or eye pain ENT:  No hearing changes.  No history of nose bleeds.   RESPIRATORY: No cough, wheezing and shortness of breath.   CARDIOVASCULAR: Negative for chest pain, and palpitations.   GI: Negative for abdominal discomfort, blood in stools or black stools.  No recent change in bowel habits.   GU:  No history of incontinence.   MUSCLOSKELETAL: No history of joint pain or swelling.  No myalgias.   SKIN: Negative for lesions, rash, and itching.   ENDOCRINE: Negative for cold or heat intolerance, polydipsia or goiter.   PSYCH:  No depression or anxiety symptoms.   NEURO: As Above.   Vital Signs:  BP 100/64   Pulse (!) 57   Ht 5\' 5"  (1.651 m)   Wt 135 lb 5 oz (61.4 kg)   SpO2 99%   BMI 22.52 kg/m   Neurological Exam: MENTAL STATUS including orientation to time, place, person, recent and remote memory, attention span and concentration, language, and fund of knowledge is normal.  Speech is not dysarthric.  CRANIAL NERVES: Pupils equal round and reactive to light.  Normal conjugate, extra-ocular eye movements in all directions of gaze.  Subtle left ptosis without worsening with sustained upgaze (old, stable).  Face is symmetric with normal motor strength testing of facial muscles. Palate elevates symmetrically.  Tongue is midline.  MOTOR:  Motor strength is 5/5 in all extremities.  Tone is normal.   Neck ROM is restricted to 70 degrees bilaterally.  Extension and flexion intact.  MSRs:  Reflexes are 2+/4 throughout.  COORDINATION/GAIT:  Gait narrow based and stable.   Data: Labs received from [facility] dated 09/25/2011: Musk antibody titer negative, acetylcholine binding antibody (<0.03) - negative  CT chest 10/20/2014: no evidence of thymoma  CT head 05/09/2008: No acute intracranial abnormality.   US carotids 05/09/2008: No significant carotid stenosis identified by duplex ultrasound. There is a mild amount of plaque present bilaterally. Estimated ICA stenoses are less than 50% bilaterally.  Incidental cystic nodules in both lobes of the thyroid gland which appears small and are likely benign.   IMPRESSION: Ocular myasthenia gravis, seronegative, thymoma negative.  Clinically stable.   - Diagnosed 2010 based on single fiber EMG, symptoms manifesting with diplopia  - Previously treated with prednisone 20mg  (stopped due to osteoporeosis) and IVIG (no benefit)  - Continue mestinon to 60mg  daily and ok to take extra dose as needed  - Symptoms are not severe to start immunomodulatory therapy.  If needed, extra caution with prednisone given his osteoporosis  Neck stiffness, most likely due to degenerative arthritis. Follow clinically as he denies pain.  Neck stretches recommended, PT declined.  Return to clinic in 1 year    The duration of this appointment visit was 25 minutes of face-to-face time with the patient.  Greater than 50% of this time was spent in counseling, explanation of diagnosis, planning of further management, and coordination of care.   Thank you for  allowing me to participate in patient's care.  If I can answer any additional questions, I would be pleased to do so.    Sincerely,    Fawzi Melman K. Posey Pronto, DO

## 2016-12-28 NOTE — Patient Instructions (Signed)
1.  Continue mestinon as you are taking 2.  Return clinic in 1 year

## 2017-01-07 DIAGNOSIS — M810 Age-related osteoporosis without current pathological fracture: Secondary | ICD-10-CM | POA: Diagnosis not present

## 2017-06-16 DIAGNOSIS — Z79899 Other long term (current) drug therapy: Secondary | ICD-10-CM | POA: Diagnosis not present

## 2017-06-16 DIAGNOSIS — E7800 Pure hypercholesterolemia, unspecified: Secondary | ICD-10-CM | POA: Diagnosis not present

## 2017-06-16 DIAGNOSIS — M810 Age-related osteoporosis without current pathological fracture: Secondary | ICD-10-CM | POA: Diagnosis not present

## 2017-06-17 DIAGNOSIS — Z23 Encounter for immunization: Secondary | ICD-10-CM | POA: Diagnosis not present

## 2017-06-17 DIAGNOSIS — I2510 Atherosclerotic heart disease of native coronary artery without angina pectoris: Secondary | ICD-10-CM | POA: Diagnosis not present

## 2017-06-17 DIAGNOSIS — N400 Enlarged prostate without lower urinary tract symptoms: Secondary | ICD-10-CM | POA: Diagnosis not present

## 2017-06-17 DIAGNOSIS — Z79899 Other long term (current) drug therapy: Secondary | ICD-10-CM | POA: Diagnosis not present

## 2017-06-17 DIAGNOSIS — M810 Age-related osteoporosis without current pathological fracture: Secondary | ICD-10-CM | POA: Diagnosis not present

## 2017-06-17 DIAGNOSIS — E7800 Pure hypercholesterolemia, unspecified: Secondary | ICD-10-CM | POA: Diagnosis not present

## 2017-06-17 DIAGNOSIS — M899 Disorder of bone, unspecified: Secondary | ICD-10-CM | POA: Diagnosis not present

## 2017-06-17 DIAGNOSIS — Z0000 Encounter for general adult medical examination without abnormal findings: Secondary | ICD-10-CM | POA: Diagnosis not present

## 2017-06-17 DIAGNOSIS — R7301 Impaired fasting glucose: Secondary | ICD-10-CM | POA: Diagnosis not present

## 2017-06-17 DIAGNOSIS — D649 Anemia, unspecified: Secondary | ICD-10-CM | POA: Diagnosis not present

## 2017-06-17 DIAGNOSIS — G7000 Myasthenia gravis without (acute) exacerbation: Secondary | ICD-10-CM | POA: Diagnosis not present

## 2017-06-23 DIAGNOSIS — D649 Anemia, unspecified: Secondary | ICD-10-CM | POA: Diagnosis not present

## 2017-07-12 DIAGNOSIS — M810 Age-related osteoporosis without current pathological fracture: Secondary | ICD-10-CM | POA: Diagnosis not present

## 2017-07-21 DIAGNOSIS — Z79899 Other long term (current) drug therapy: Secondary | ICD-10-CM | POA: Diagnosis not present

## 2017-07-21 DIAGNOSIS — R7301 Impaired fasting glucose: Secondary | ICD-10-CM | POA: Diagnosis not present

## 2017-07-21 DIAGNOSIS — I2510 Atherosclerotic heart disease of native coronary artery without angina pectoris: Secondary | ICD-10-CM | POA: Diagnosis not present

## 2017-07-21 DIAGNOSIS — M899 Disorder of bone, unspecified: Secondary | ICD-10-CM | POA: Diagnosis not present

## 2017-07-21 DIAGNOSIS — M810 Age-related osteoporosis without current pathological fracture: Secondary | ICD-10-CM | POA: Diagnosis not present

## 2017-07-21 DIAGNOSIS — D649 Anemia, unspecified: Secondary | ICD-10-CM | POA: Diagnosis not present

## 2017-07-21 DIAGNOSIS — E7800 Pure hypercholesterolemia, unspecified: Secondary | ICD-10-CM | POA: Diagnosis not present

## 2017-07-21 DIAGNOSIS — G7000 Myasthenia gravis without (acute) exacerbation: Secondary | ICD-10-CM | POA: Diagnosis not present

## 2017-07-21 DIAGNOSIS — Z0000 Encounter for general adult medical examination without abnormal findings: Secondary | ICD-10-CM | POA: Diagnosis not present

## 2017-07-21 DIAGNOSIS — Z23 Encounter for immunization: Secondary | ICD-10-CM | POA: Diagnosis not present

## 2017-07-21 DIAGNOSIS — N400 Enlarged prostate without lower urinary tract symptoms: Secondary | ICD-10-CM | POA: Diagnosis not present

## 2017-08-16 DIAGNOSIS — M810 Age-related osteoporosis without current pathological fracture: Secondary | ICD-10-CM | POA: Diagnosis not present

## 2017-08-20 DIAGNOSIS — H524 Presbyopia: Secondary | ICD-10-CM | POA: Diagnosis not present

## 2017-08-20 DIAGNOSIS — H43811 Vitreous degeneration, right eye: Secondary | ICD-10-CM | POA: Diagnosis not present

## 2017-08-20 DIAGNOSIS — H2513 Age-related nuclear cataract, bilateral: Secondary | ICD-10-CM | POA: Diagnosis not present

## 2017-08-20 DIAGNOSIS — H04123 Dry eye syndrome of bilateral lacrimal glands: Secondary | ICD-10-CM | POA: Diagnosis not present

## 2017-09-28 DIAGNOSIS — D649 Anemia, unspecified: Secondary | ICD-10-CM | POA: Diagnosis not present

## 2017-12-02 DIAGNOSIS — M436 Torticollis: Secondary | ICD-10-CM | POA: Diagnosis not present

## 2017-12-14 DIAGNOSIS — M542 Cervicalgia: Secondary | ICD-10-CM | POA: Diagnosis not present

## 2017-12-20 DIAGNOSIS — M542 Cervicalgia: Secondary | ICD-10-CM | POA: Diagnosis not present

## 2017-12-27 DIAGNOSIS — M47812 Spondylosis without myelopathy or radiculopathy, cervical region: Secondary | ICD-10-CM | POA: Diagnosis not present

## 2017-12-29 ENCOUNTER — Ambulatory Visit: Payer: PPO | Admitting: Neurology

## 2017-12-29 ENCOUNTER — Encounter: Payer: Self-pay | Admitting: Neurology

## 2017-12-29 VITALS — BP 120/80 | HR 52 | Ht 65.0 in | Wt 130.1 lb

## 2017-12-29 DIAGNOSIS — G7000 Myasthenia gravis without (acute) exacerbation: Secondary | ICD-10-CM

## 2017-12-29 MED ORDER — PYRIDOSTIGMINE BROMIDE 60 MG PO TABS: 60.0000 mg | ORAL_TABLET | Freq: Two times a day (BID) | ORAL | 3 refills | Status: DC

## 2017-12-29 NOTE — Progress Notes (Signed)
Follow-up Visit   Date: 12/29/17    [name] MRN: [id number] DOB: 1946-01-14   Interim History: Sean Rosario is a 72 y.o. right-handed Caucasian male with history of ocular myasthenia (diagnosed in 2010, on mestinon 60mg  daily), osteoporesis, and hyperlipidemia returning for follow-up of ocular myasthenia gravis.  History of present illness: Symptoms started in 2009 with diplopia and was seen by Dr. Vallarie Mare at [facility] who performed single fiber EMG that was mildly abnormal. He does not recall that his antibodies were positive. He was diagnosed with ocular myasthenia in 2010 and was started on mestinon with no benefit.  Prednisone was slowly titrated to 20mg  for one year and diplopia resolved. He was doing well until 2013, when his diplopia returned. Due to osteoporosis, prednisone was not started, but a trial of IVIG was given without benefit. He was started on mestinon 60mg  daily which resolved symptoms.   He is doing well on mestinon 60mg . Since the fall of 2014, he has early morning symptoms of blurry vision when he wakes up, which lasts about 30-minutes. There is no double vision.  He has never been hospitalized for myasthenia or showed signs of dysarthria, dysphagia, or limb weakness.  He was previously seeing Dr. [name] at [facility], but due to distance transitioned care here in January 2015.  He has been doing well on mestinon 60mg  daily and has not had any exacerbations.    UPDATE 12/29/2017:  He is here for 1 year follow-up visit.  He is doing well and remains on mestinon 60mg  daily, rarely does he take an extra dose. When he takes longer drives, such as to [place] where he has family, and when he is fatigued, he has some double vision; otherwise, his vision is not affected.  He has been in good health overall, no interval illnesses or hospitalizations.  No difficulty swallowing/talking, droopy eyelids, or jaw fatigue.    Medications:  Current  Outpatient Medications on File Prior to Visit  Medication Sig Dispense Refill  . alendronate (FOSAMAX) 40 MG tablet Take 40 mg by mouth every 7 (seven) days. Take with a full glass of water on an empty stomach.    Marland Kitchen aspirin 81 MG tablet Take 81 mg by mouth daily.    Marland Kitchen atorvastatin (LIPITOR) 20 MG tablet Take 20 mg by mouth daily.    . Cholecalciferol (VITAMIN D PO) Take 1 tablet by mouth daily.    . nitroGLYCERIN (NITROSTAT) 0.4 MG SL tablet Place 1 tablet (0.4 mg total) under the tongue every 5 (five) minutes as needed for chest pain. 25 tablet 11  . Omega-3 Fatty Acids (FISH OIL) 1200 MG CAPS Take 1 capsule by mouth daily.    . Probiotic Product (PROBIOTIC DAILY PO) Take 1 tablet by mouth daily.      No current facility-administered medications on file prior to visit.     Allergies: No Known Allergies   Review of Systems:  CONSTITUTIONAL: No fevers, chills, night sweats, or weight loss.   EYES: No visual changes or eye pain ENT: No hearing changes.  No history of nose bleeds.   RESPIRATORY: No cough, wheezing and shortness of breath.   CARDIOVASCULAR: Negative for chest pain, and palpitations.   GI: Negative for abdominal discomfort, blood in stools or black stools.  No recent change in bowel habits.   GU:  No history of incontinence.   MUSCLOSKELETAL: No history of joint pain or swelling.  No myalgias.   SKIN: Negative  for lesions, rash, and itching.   ENDOCRINE: Negative for cold or heat intolerance, polydipsia or goiter.   PSYCH:  No depression or anxiety symptoms.   NEURO: As Above.   Vital Signs:  BP 120/80   Pulse (!) 52   Ht 5\' 5"  (1.651 m)   Wt 130 lb 2 oz (59 kg)   SpO2 98%   BMI 21.65 kg/m   General Medical Exam:   General:  Well appearing, comfortable    Neck:   No carotid bruits. Respiratory:  Clear to auscultation, good air entry bilaterally.   Cardiac:  Regular rate and rhythm, no murmur.     Neurological Exam: MENTAL STATUS including orientation to  time, place, person, recent and remote memory, attention span and concentration, language, and fund of knowledge is normal.  Speech is not dysarthric.  CRANIAL NERVES: Pupils equal round and reactive to light.  Normal conjugate, extra-ocular eye movements in all directions of gaze.  Subtle left ptosis without worsening with sustained upgaze (old, stable).  Face is symmetric with normal motor strength testing of facial muscles. Palate elevates symmetrically.  Tongue is midline and strength is 5/5.  MOTOR:  Motor strength is 5/5 in all extremities, no fatigability.  Tone is normal.   COORDINATION/GAIT:  Gait narrow based and stable.   Data: Labs received from [facility] dated 09/25/2011: Musk antibody titer negative, acetylcholine binding antibody (<0.03) - negative  CT chest 10/20/2014: no evidence of thymoma  CT head 05/09/2008: No acute intracranial abnormality.   US carotids 05/09/2008: No significant carotid stenosis identified by duplex ultrasound. There is a mild amount of plaque present bilaterally. Estimated ICA stenoses are less than 50% bilaterally. Incidental cystic nodules in both lobes of the thyroid gland which appears small and are likely benign.   IMPRESSION: Ocular myasthenia gravis, seronegative, thymoma negative.  Doing great on mestinon alone, exam only shows mild left ptosis.   - Diagnosed 2010 based on single fiber EMG, symptoms manifesting with diplopia  - Previously treated with prednisone 20mg  (stopped due to osteoporeosis) and IVIG (no benefit)  - Continue mestinon to 60mg  daily and ok to take extra dose as needed  Return to clinic in 1 year  Greater than 50% of this 15 minute visit was spent in counseling, explanation of diagnosis, planning of further management, and coordination of care.    Thank you for allowing me to participate in patient's care.  If I can answer any additional questions, I would be pleased to do so.    Sincerely,    Ezrie Bunyan K.  Posey Pronto, DO

## 2017-12-29 NOTE — Patient Instructions (Signed)
It was great to see you today!  Continue pyridostigmine 60mg  daily and you can take an extra dose as needed  Return to clinic in 1 year

## 2018-01-03 DIAGNOSIS — M542 Cervicalgia: Secondary | ICD-10-CM | POA: Diagnosis not present

## 2018-01-06 DIAGNOSIS — D649 Anemia, unspecified: Secondary | ICD-10-CM | POA: Diagnosis not present

## 2018-01-11 DIAGNOSIS — M810 Age-related osteoporosis without current pathological fracture: Secondary | ICD-10-CM | POA: Diagnosis not present

## 2018-01-23 DIAGNOSIS — N400 Enlarged prostate without lower urinary tract symptoms: Secondary | ICD-10-CM | POA: Diagnosis not present

## 2018-01-23 DIAGNOSIS — R319 Hematuria, unspecified: Secondary | ICD-10-CM | POA: Diagnosis not present

## 2018-01-23 DIAGNOSIS — N390 Urinary tract infection, site not specified: Secondary | ICD-10-CM | POA: Diagnosis not present

## 2018-03-29 DIAGNOSIS — H00016 Hordeolum externum left eye, unspecified eyelid: Secondary | ICD-10-CM | POA: Diagnosis not present

## 2018-03-29 DIAGNOSIS — D649 Anemia, unspecified: Secondary | ICD-10-CM | POA: Diagnosis not present

## 2018-05-20 ENCOUNTER — Encounter: Payer: Self-pay | Admitting: Neurology

## 2018-06-29 DIAGNOSIS — Z125 Encounter for screening for malignant neoplasm of prostate: Secondary | ICD-10-CM | POA: Diagnosis not present

## 2018-06-29 DIAGNOSIS — Z79899 Other long term (current) drug therapy: Secondary | ICD-10-CM | POA: Diagnosis not present

## 2018-06-29 DIAGNOSIS — E7800 Pure hypercholesterolemia, unspecified: Secondary | ICD-10-CM | POA: Diagnosis not present

## 2018-07-06 DIAGNOSIS — G7000 Myasthenia gravis without (acute) exacerbation: Secondary | ICD-10-CM | POA: Diagnosis not present

## 2018-07-06 DIAGNOSIS — N400 Enlarged prostate without lower urinary tract symptoms: Secondary | ICD-10-CM | POA: Diagnosis not present

## 2018-07-06 DIAGNOSIS — M810 Age-related osteoporosis without current pathological fracture: Secondary | ICD-10-CM | POA: Diagnosis not present

## 2018-07-06 DIAGNOSIS — Z0000 Encounter for general adult medical examination without abnormal findings: Secondary | ICD-10-CM | POA: Diagnosis not present

## 2018-07-06 DIAGNOSIS — Z125 Encounter for screening for malignant neoplasm of prostate: Secondary | ICD-10-CM | POA: Diagnosis not present

## 2018-07-06 DIAGNOSIS — Z79899 Other long term (current) drug therapy: Secondary | ICD-10-CM | POA: Diagnosis not present

## 2018-07-06 DIAGNOSIS — I2510 Atherosclerotic heart disease of native coronary artery without angina pectoris: Secondary | ICD-10-CM | POA: Diagnosis not present

## 2018-07-06 DIAGNOSIS — R7301 Impaired fasting glucose: Secondary | ICD-10-CM | POA: Diagnosis not present

## 2018-07-06 DIAGNOSIS — E7800 Pure hypercholesterolemia, unspecified: Secondary | ICD-10-CM | POA: Diagnosis not present

## 2018-07-06 DIAGNOSIS — Z862 Personal history of diseases of the blood and blood-forming organs and certain disorders involving the immune mechanism: Secondary | ICD-10-CM | POA: Diagnosis not present

## 2018-07-14 DIAGNOSIS — M810 Age-related osteoporosis without current pathological fracture: Secondary | ICD-10-CM | POA: Diagnosis not present

## 2018-07-19 DIAGNOSIS — D225 Melanocytic nevi of trunk: Secondary | ICD-10-CM | POA: Diagnosis not present

## 2018-07-19 DIAGNOSIS — Z1283 Encounter for screening for malignant neoplasm of skin: Secondary | ICD-10-CM | POA: Diagnosis not present

## 2018-09-05 DIAGNOSIS — G7000 Myasthenia gravis without (acute) exacerbation: Secondary | ICD-10-CM | POA: Diagnosis not present

## 2018-09-05 DIAGNOSIS — R972 Elevated prostate specific antigen [PSA]: Secondary | ICD-10-CM | POA: Diagnosis not present

## 2018-09-05 DIAGNOSIS — Z8042 Family history of malignant neoplasm of prostate: Secondary | ICD-10-CM | POA: Diagnosis not present

## 2018-12-19 NOTE — Progress Notes (Signed)
Virtual Visit via Video Note The purpose of this virtual visit is to provide medical care while limiting exposure to the novel coronavirus.    Consent was obtained for video visit:  Yes.   Answered questions that patient had about telehealth interaction:  Yes.   I discussed the limitations, risks, security and privacy concerns of performing an evaluation and management service by telemedicine. I also discussed with the patient that there may be a patient responsible charge related to this service. The patient expressed understanding and agreed to proceed.  Pt location: Home Physician Location: office Name of referring provider:  Hulan Fess, MD I connected with Sean Rosario at patients initiation/request on 12/21/2018 at  9:00 AM EDT by video enabled telemedicine application and verified that I am speaking with the correct person using two identifiers. Pt MRN:  [id number] Pt DOB:  07-24-1946 Video Participants:  Francee Piccolo K Foti   History of Present Illness: This is a 73 y.o. male returning for follow-up of ocular myasthenia gravis.  Over the past year, he has been doing relatively well.  He has not had any illnesses, hospitalizations, or exacerbation of myasthenia.  There has been a very slight change with respect to myasthenia, such that he has noticed mild double vision especially in the evenings.  This is worse when he is watching a play, at a symphony, or when driving.  He has tried taking Mestinon 60 mg twice daily but did not appreciate significant improvement.  There has been no worsening ptosis, shortness of breath, difficulty swallowing, or talking.  Due to severe osteoporosis, he is not on prednisone.    Past Medical History:  Diagnosis Date  . Hyperlipidemia    Patient previously elected lifestyle changes in lieu of meds  . Myasthenia gravis    Only manifested by intermittent double vision. Maintained on mestinon  . Osteoporosis    Secondary to prednisone from myasthenia  gravis for several years  . Pericarditis    20 years ago    Current Outpatient Medications on File Prior to Visit  Medication Sig Dispense Refill  . aspirin 81 MG tablet Take 81 mg by mouth daily.    Marland Kitchen atorvastatin (LIPITOR) 20 MG tablet Take 20 mg by mouth daily.    . nitroGLYCERIN (NITROSTAT) 0.4 MG SL tablet Place 1 tablet (0.4 mg total) under the tongue every 5 (five) minutes as needed for chest pain. 25 tablet 11  . Omega-3 Fatty Acids (FISH OIL) 1200 MG CAPS Take 1 capsule by mouth daily.    . Probiotic Product (PROBIOTIC DAILY PO) Take 1 tablet by mouth daily.     Marland Kitchen pyridostigmine (MESTINON) 60 MG tablet Take 1 tablet (60 mg total) by mouth 2 (two) times daily. (Patient taking differently: Take 60 mg by mouth daily. ) 180 tablet 3   No current facility-administered medications on file prior to visit.      Review of Systems:  CONSTITUTIONAL: No fevers, chills, night sweats, or weight loss.  EYES: N+o visual changes or eye pain ENT: No hearing changes.  No history of nose bleeds.   RESPIRATORY: No cough, wheezing and shortness of breath.   CARDIOVASCULAR: Negative for chest pain, and palpitations.   GI: Negative for abdominal discomfort, blood in stools or black stools.  No recent change in bowel habits.   GU:  No history of incontinence.   MUSCLOSKELETAL: No history of joint pain or swelling.  No myalgias.   SKIN: Negative for lesions, rash, and itching.  ENDOCRINE: Negative for cold or heat intolerance, polydipsia or goiter.   PSYCH:  No depression or anxiety symptoms.   NEURO: As Above.  Observations/Objective:   Patient is awake, alert, and appears comfortable.   Extraocular muscles are intact.  Mild left ptosis at baseline, no worsening with sustained upgaze.  Face is symmetric.  He is able to puff air and cheeks symmetrically.  Speech is not dysarthric. Tongue is midline. Antigravity in all extremities.  No pronator drift. Gait appears normal.  He is easily able to  stand up from low chair with arms crossed.   Assessment and Plan:  Ocular myasthenia gravis, seronegative, with mild exacerbation. He was diagnosed by [facility] in 2020.    He has mild left ptosis and over the last year has experienced mild diplopia in the evenings.  Symptoms are not severe enough to warrant corticosteroids at this time.   - Increase mestinon to 60mg  twice daily  - Consider using eye patch when watching a play or try to get seats closer to the stage  - No benefit with IVIG and prednisone (max dose 20mg ) was discontinued due to osteoporosis   Follow Up Instructions:   I discussed the assessment and treatment plan with the patient. The patient was provided an opportunity to ask questions and all were answered. The patient agreed with the plan and demonstrated an understanding of the instructions.   The patient was advised to call back or seek an in-person evaluation if the symptoms worsen or if the condition fails to improve as anticipated.  Follow-up in 1 year or sooner as needed   Alda Berthold, DO

## 2018-12-21 ENCOUNTER — Other Ambulatory Visit: Payer: Self-pay

## 2018-12-21 ENCOUNTER — Encounter: Payer: Self-pay | Admitting: *Deleted

## 2018-12-21 ENCOUNTER — Telehealth (INDEPENDENT_AMBULATORY_CARE_PROVIDER_SITE_OTHER): Payer: PPO | Admitting: Neurology

## 2018-12-21 DIAGNOSIS — G7000 Myasthenia gravis without (acute) exacerbation: Secondary | ICD-10-CM | POA: Diagnosis not present

## 2018-12-21 MED ORDER — PYRIDOSTIGMINE BROMIDE 60 MG PO TABS: ORAL_TABLET | ORAL | 3 refills | Status: DC

## 2018-12-30 ENCOUNTER — Ambulatory Visit: Payer: PPO | Admitting: Neurology

## 2019-01-13 DIAGNOSIS — M810 Age-related osteoporosis without current pathological fracture: Secondary | ICD-10-CM | POA: Diagnosis not present

## 2019-07-10 DIAGNOSIS — Z79899 Other long term (current) drug therapy: Secondary | ICD-10-CM | POA: Diagnosis not present

## 2019-07-10 DIAGNOSIS — Z862 Personal history of diseases of the blood and blood-forming organs and certain disorders involving the immune mechanism: Secondary | ICD-10-CM | POA: Diagnosis not present

## 2019-07-10 DIAGNOSIS — E7800 Pure hypercholesterolemia, unspecified: Secondary | ICD-10-CM | POA: Diagnosis not present

## 2019-07-10 DIAGNOSIS — Z125 Encounter for screening for malignant neoplasm of prostate: Secondary | ICD-10-CM | POA: Diagnosis not present

## 2019-07-17 DIAGNOSIS — M810 Age-related osteoporosis without current pathological fracture: Secondary | ICD-10-CM | POA: Diagnosis not present

## 2019-07-17 DIAGNOSIS — Z862 Personal history of diseases of the blood and blood-forming organs and certain disorders involving the immune mechanism: Secondary | ICD-10-CM | POA: Diagnosis not present

## 2019-07-19 DIAGNOSIS — R7301 Impaired fasting glucose: Secondary | ICD-10-CM | POA: Diagnosis not present

## 2019-07-19 DIAGNOSIS — E7800 Pure hypercholesterolemia, unspecified: Secondary | ICD-10-CM | POA: Diagnosis not present

## 2019-07-19 DIAGNOSIS — G7000 Myasthenia gravis without (acute) exacerbation: Secondary | ICD-10-CM | POA: Diagnosis not present

## 2019-07-19 DIAGNOSIS — M810 Age-related osteoporosis without current pathological fracture: Secondary | ICD-10-CM | POA: Diagnosis not present

## 2019-07-19 DIAGNOSIS — Z0000 Encounter for general adult medical examination without abnormal findings: Secondary | ICD-10-CM | POA: Diagnosis not present

## 2019-07-19 DIAGNOSIS — I2510 Atherosclerotic heart disease of native coronary artery without angina pectoris: Secondary | ICD-10-CM | POA: Diagnosis not present

## 2019-07-19 DIAGNOSIS — Z79899 Other long term (current) drug therapy: Secondary | ICD-10-CM | POA: Diagnosis not present

## 2019-07-19 DIAGNOSIS — L570 Actinic keratosis: Secondary | ICD-10-CM | POA: Diagnosis not present

## 2019-08-09 DIAGNOSIS — X32XXXD Exposure to sunlight, subsequent encounter: Secondary | ICD-10-CM | POA: Diagnosis not present

## 2019-08-09 DIAGNOSIS — D225 Melanocytic nevi of trunk: Secondary | ICD-10-CM | POA: Diagnosis not present

## 2019-08-09 DIAGNOSIS — L718 Other rosacea: Secondary | ICD-10-CM | POA: Diagnosis not present

## 2019-08-09 DIAGNOSIS — L821 Other seborrheic keratosis: Secondary | ICD-10-CM | POA: Diagnosis not present

## 2019-08-09 DIAGNOSIS — M25512 Pain in left shoulder: Secondary | ICD-10-CM | POA: Diagnosis not present

## 2019-08-09 DIAGNOSIS — L570 Actinic keratosis: Secondary | ICD-10-CM | POA: Diagnosis not present

## 2019-09-18 DIAGNOSIS — M25512 Pain in left shoulder: Secondary | ICD-10-CM | POA: Diagnosis not present

## 2019-09-25 DIAGNOSIS — M75122 Complete rotator cuff tear or rupture of left shoulder, not specified as traumatic: Secondary | ICD-10-CM | POA: Diagnosis not present

## 2019-09-25 DIAGNOSIS — H43811 Vitreous degeneration, right eye: Secondary | ICD-10-CM | POA: Diagnosis not present

## 2019-09-25 DIAGNOSIS — H52203 Unspecified astigmatism, bilateral: Secondary | ICD-10-CM | POA: Diagnosis not present

## 2019-09-25 DIAGNOSIS — M25512 Pain in left shoulder: Secondary | ICD-10-CM | POA: Diagnosis not present

## 2019-09-25 DIAGNOSIS — H25813 Combined forms of age-related cataract, bilateral: Secondary | ICD-10-CM | POA: Diagnosis not present

## 2019-09-25 DIAGNOSIS — H04123 Dry eye syndrome of bilateral lacrimal glands: Secondary | ICD-10-CM | POA: Diagnosis not present

## 2019-10-17 DIAGNOSIS — Z4889 Encounter for other specified surgical aftercare: Secondary | ICD-10-CM | POA: Diagnosis not present

## 2019-10-17 DIAGNOSIS — S43432A Superior glenoid labrum lesion of left shoulder, initial encounter: Secondary | ICD-10-CM | POA: Diagnosis not present

## 2019-10-17 DIAGNOSIS — M65812 Other synovitis and tenosynovitis, left shoulder: Secondary | ICD-10-CM | POA: Diagnosis not present

## 2019-10-17 DIAGNOSIS — G8918 Other acute postprocedural pain: Secondary | ICD-10-CM | POA: Diagnosis not present

## 2019-10-17 DIAGNOSIS — M659 Synovitis and tenosynovitis, unspecified: Secondary | ICD-10-CM | POA: Diagnosis not present

## 2019-10-17 DIAGNOSIS — M7542 Impingement syndrome of left shoulder: Secondary | ICD-10-CM | POA: Diagnosis not present

## 2019-10-17 DIAGNOSIS — R600 Localized edema: Secondary | ICD-10-CM | POA: Diagnosis not present

## 2019-10-17 DIAGNOSIS — M19012 Primary osteoarthritis, left shoulder: Secondary | ICD-10-CM | POA: Diagnosis not present

## 2019-10-17 DIAGNOSIS — M75122 Complete rotator cuff tear or rupture of left shoulder, not specified as traumatic: Secondary | ICD-10-CM | POA: Diagnosis not present

## 2019-10-17 DIAGNOSIS — M25512 Pain in left shoulder: Secondary | ICD-10-CM | POA: Diagnosis not present

## 2019-10-17 DIAGNOSIS — M94212 Chondromalacia, left shoulder: Secondary | ICD-10-CM | POA: Diagnosis not present

## 2019-10-27 DIAGNOSIS — M25512 Pain in left shoulder: Secondary | ICD-10-CM | POA: Diagnosis not present

## 2019-11-09 DIAGNOSIS — M25512 Pain in left shoulder: Secondary | ICD-10-CM | POA: Diagnosis not present

## 2019-11-15 DIAGNOSIS — M25512 Pain in left shoulder: Secondary | ICD-10-CM | POA: Diagnosis not present

## 2019-11-22 DIAGNOSIS — M25512 Pain in left shoulder: Secondary | ICD-10-CM | POA: Diagnosis not present

## 2019-11-28 DIAGNOSIS — Z8042 Family history of malignant neoplasm of prostate: Secondary | ICD-10-CM | POA: Diagnosis not present

## 2019-11-28 DIAGNOSIS — R972 Elevated prostate specific antigen [PSA]: Secondary | ICD-10-CM | POA: Diagnosis not present

## 2019-12-01 DIAGNOSIS — M25512 Pain in left shoulder: Secondary | ICD-10-CM | POA: Diagnosis not present

## 2019-12-05 DIAGNOSIS — M25512 Pain in left shoulder: Secondary | ICD-10-CM | POA: Diagnosis not present

## 2019-12-07 DIAGNOSIS — M25512 Pain in left shoulder: Secondary | ICD-10-CM | POA: Diagnosis not present

## 2019-12-14 DIAGNOSIS — M25512 Pain in left shoulder: Secondary | ICD-10-CM | POA: Diagnosis not present

## 2019-12-19 DIAGNOSIS — M25512 Pain in left shoulder: Secondary | ICD-10-CM | POA: Diagnosis not present

## 2019-12-21 DIAGNOSIS — M25512 Pain in left shoulder: Secondary | ICD-10-CM | POA: Diagnosis not present

## 2019-12-22 ENCOUNTER — Other Ambulatory Visit: Payer: Self-pay

## 2019-12-22 ENCOUNTER — Encounter: Payer: Self-pay | Admitting: Neurology

## 2019-12-22 ENCOUNTER — Ambulatory Visit: Payer: PPO | Admitting: Neurology

## 2019-12-22 VITALS — BP 112/72 | HR 57 | Resp 18 | Ht 65.0 in | Wt 132.0 lb

## 2019-12-22 DIAGNOSIS — G7000 Myasthenia gravis without (acute) exacerbation: Secondary | ICD-10-CM | POA: Diagnosis not present

## 2019-12-22 MED ORDER — PYRIDOSTIGMINE BROMIDE 60 MG PO TABS: ORAL_TABLET | ORAL | 3 refills | Status: DC

## 2019-12-22 NOTE — Patient Instructions (Addendum)
Continue mestinon 60mg  twice daily. For when double vision is problematic, you can try using an eye patch Return to clinic in 1 year

## 2019-12-22 NOTE — Progress Notes (Signed)
Follow-up Visit   Date: 12/22/19    Sean Rosario MRN: [id number] DOB: [birth year]   Interim History: Sean Rosario is a 74 y.o. right-handed Caucasian male with history of ocular myasthenia (diagnosed in 2010, on mestinon 60mg  daily), osteoporesis, and hyperlipidemia returning for follow-up of ocular myasthenia gravis.  History of present illness: Symptoms started in 2009 with diplopia and was seen by Dr. Vallarie Mare at [facility] who performed single fiber EMG that was mildly abnormal. He does not recall that his antibodies were positive. He was diagnosed with ocular myasthenia in 2010 and was started on mestinon with no benefit.  Prednisone was slowly titrated to 20mg  for one year and diplopia resolved. He was doing well until 2013, when his diplopia returned. Due to osteoporosis, prednisone was not started, but a trial of IVIG was given without benefit. He was started on mestinon 60mg  daily which resolved symptoms.   He has never been hospitalized for myasthenia or showed signs of dysarthria, dysphagia, or limb weakness.  He was previously seeing Dr. [name] at [facility], but due to distance transitioned care here in January 2015.  He has been doing well on mestinon 60mg  daily and has not had any exacerbations.    UPDATE 12/22/2019:  He is here for follow-up visit.  He reports having mild increase in double vision usually later in the day, especially after a long day.  He has taken mestinon 60mg  twice daily, but did not appreciate marked benefit. Symptoms are not severe enough to change medications.  No droopiness, difficulty swallowing/talking, or limb weakness. He has received both COVID vaccinations and tolerated it well.   Medications:  Current Outpatient Medications on File Prior to Visit  Medication Sig Dispense Refill  . aspirin 81 MG tablet Take 81 mg by mouth daily.    Marland Kitchen atorvastatin (LIPITOR) 20 MG tablet Take 20 mg by mouth daily.    . cholecalciferol (VITAMIN D3)  25 MCG (1000 UNIT) tablet Take 1,000 Units by mouth daily.    Marland Kitchen denosumab (PROLIA) 60 MG/ML SOSY injection Inject 60 mg into the skin every 6 (six) months.    . nitroGLYCERIN (NITROSTAT) 0.4 MG SL tablet Place 1 tablet (0.4 mg total) under the tongue every 5 (five) minutes as needed for chest pain. 25 tablet 11  . Omega-3 Fatty Acids (FISH OIL) 1200 MG CAPS Take 1 capsule by mouth daily.    . Probiotic Product (PROBIOTIC DAILY PO) Take 1 tablet by mouth daily.      No current facility-administered medications on file prior to visit.    Allergies: No Known Allergies   Vital Signs:  BP 112/72   Pulse (!) 57   Resp 18   Ht 5\' 5"  (1.651 m)   Wt 132 lb (59.9 kg)   SpO2 98%   BMI 21.97 kg/m    Neurological Exam: MENTAL STATUS including orientation to time, place, person, recent and remote memory, attention span and concentration, language, and fund of knowledge is normal.  Speech is not dysarthric.  CRANIAL NERVES:  Normal conjugate, extra-ocular eye movements in all directions of gaze.  Subtle left ptosis no worsening with sustained upgaze (old, stable).  Face is symmetric with normal motor strength testing of facial muscles.  Tongue is midline and strength is 5/5.  MOTOR:  Motor strength is 5/5 in all extremities, no fatigability.  Tone is normal.   COORDINATION/GAIT:  Gait narrow based and stable.   Data: Labs received from [facility] dated 09/25/2011:  Musk antibody titer negative, acetylcholine binding antibody (<0.03) - negative  CT chest 10/20/2014: no evidence of thymoma  CT head 05/09/2008: No acute intracranial abnormality.   US carotids 05/09/2008: No significant carotid stenosis identified by duplex ultrasound. There is a mild amount of plaque present bilaterally. Estimated ICA stenoses are less than 50% bilaterally. Incidental cystic nodules in both lobes of the thyroid gland which appears small and are likely benign.   IMPRESSION: Ocular seronegative myasthenia  gravis, thymoma negative (diagnosed 2010 on [facility]), symptoms manifesting with diplopia.  Clinically, he is doing relatively well on Mestinon alone.  He is reporting increased diplopia, but does not feel it is severe enough to start corticosteroids.  He has previously been on prednisone 20mg  (stopped due to osteoporosis) and IVIG (no benefit)  -Continue Mestinon 60 mg twice daily.  Refills provided  -Although not ideal, he can try using an eye patch when symptoms are bothersome which will help  Return to clinic in 1 year  Thank you for allowing me to participate in patient's care.  If I can answer any additional questions, I would be pleased to do so.    Sincerely,    Thana Ramp K. Posey Pronto, DO

## 2019-12-26 DIAGNOSIS — M25512 Pain in left shoulder: Secondary | ICD-10-CM | POA: Diagnosis not present

## 2019-12-28 DIAGNOSIS — M25512 Pain in left shoulder: Secondary | ICD-10-CM | POA: Diagnosis not present

## 2020-01-02 DIAGNOSIS — M25512 Pain in left shoulder: Secondary | ICD-10-CM | POA: Diagnosis not present

## 2020-01-04 DIAGNOSIS — M25512 Pain in left shoulder: Secondary | ICD-10-CM | POA: Diagnosis not present

## 2020-01-09 DIAGNOSIS — M25512 Pain in left shoulder: Secondary | ICD-10-CM | POA: Diagnosis not present

## 2020-01-11 DIAGNOSIS — M25512 Pain in left shoulder: Secondary | ICD-10-CM | POA: Diagnosis not present

## 2020-01-15 DIAGNOSIS — M810 Age-related osteoporosis without current pathological fracture: Secondary | ICD-10-CM | POA: Diagnosis not present

## 2020-01-16 DIAGNOSIS — M25512 Pain in left shoulder: Secondary | ICD-10-CM | POA: Diagnosis not present

## 2020-01-18 DIAGNOSIS — M25512 Pain in left shoulder: Secondary | ICD-10-CM | POA: Diagnosis not present

## 2020-01-23 DIAGNOSIS — M25512 Pain in left shoulder: Secondary | ICD-10-CM | POA: Diagnosis not present

## 2020-01-26 DIAGNOSIS — M25512 Pain in left shoulder: Secondary | ICD-10-CM | POA: Diagnosis not present

## 2020-01-29 DIAGNOSIS — M25512 Pain in left shoulder: Secondary | ICD-10-CM | POA: Diagnosis not present

## 2020-02-01 DIAGNOSIS — M25512 Pain in left shoulder: Secondary | ICD-10-CM | POA: Diagnosis not present

## 2020-02-07 DIAGNOSIS — M25512 Pain in left shoulder: Secondary | ICD-10-CM | POA: Diagnosis not present

## 2020-02-09 DIAGNOSIS — M25512 Pain in left shoulder: Secondary | ICD-10-CM | POA: Diagnosis not present

## 2020-02-14 DIAGNOSIS — M25512 Pain in left shoulder: Secondary | ICD-10-CM | POA: Diagnosis not present

## 2020-02-16 DIAGNOSIS — M25512 Pain in left shoulder: Secondary | ICD-10-CM | POA: Diagnosis not present

## 2020-02-19 DIAGNOSIS — Z4789 Encounter for other orthopedic aftercare: Secondary | ICD-10-CM | POA: Diagnosis not present

## 2020-05-30 NOTE — Progress Notes (Signed)
CARDIOLOGY CONSULT NOTE       Patient ID: Sean Rosario MRN: [id number] DOB/AGE: March 16, 1946 74 y.o.  Admit date: (Not on file) Referring Physician: Posey Pronto Primary Physician: Hulan Fess, MD Primary Cardiologist: New Reason for Consultation: CAD/Atherosclerosis  Active Problems:   * No active hospital problems. *   HPI:  74 y.o. referred by Dr Posey Pronto for CAD and atherosclerosis. Distant history of pericarditis over 20 years ago History of HLD on lipitor. History of occular myesthenia with some double vision He does take a baby aspirin daily   Had a coronary CT in 2013 calcium score was 524 82 nd percentile  Cors left dominant No disease thought to be obstructive No FFR CT available at that time F/U stress myovue at that time showed no ischemia and normal EF   He tends to be bradycardic no chronotropic incompetence on stress test above  LDL 78 HDL 97  He describes 48 hours of tightness in chest not exertional fairly constant some relief with ASA Some pleuritic component   ROS All other systems reviewed and negative except as noted above  Past Medical History:  Diagnosis Date  . Actinic keratoses   . Age related osteoporosis   . Atherosclerotic heart disease of native coronary artery with angina pectoris ([place])   . BPH (benign prostatic hyperplasia)   . CAD (coronary artery disease)   . Colon polyp, hyperplastic   . History of anemia   . Hypercholesterolemia   . Hyperlipidemia    Patient previously elected lifestyle changes in lieu of meds  . Impaired fasting glucose   . Myasthenia gravis    Only manifested by intermittent double vision. Maintained on mestinon  . Ocular myasthenia ([place])   . Osteoporosis   . Pericarditis    20 years ago    Family History  Problem Relation Age of Onset  . ALS Father        Deceased, 82  . Heart defect Father        2 valve surgeries  . Hypertension Mother   . Hypertension Sister   . Epilepsy Sister     Social History    Socioeconomic History  . Marital status: Married    Spouse name: Not on file  . Number of children: Not on file  . Years of education: Not on file  . Highest education level: Not on file  Occupational History  . Not on file  Tobacco Use  . Smoking status: Never Smoker  . Smokeless tobacco: Never Used  Vaping Use  . Vaping Use: Never used  Substance and Sexual Activity  . Alcohol use: Yes    Alcohol/week: 10.0 standard drinks    Types: 10 Glasses of wine per week    Comment: 2 glasses of wine per night most, but not all nights  . Drug use: No  . Sexual activity: Not on file  Other Topics Concern  . Not on file  Social History Narrative   He is retired from [employer].   Lives with wife and mother (69).  They have two children.   Right handed   Two story home   Drinks caffeine   Social Determinants of Health   Financial Resource Strain:   . Difficulty of Paying Living Expenses: Not on file  Food Insecurity:   . Worried About Charity fundraiser in the Last Year: Not on file  . Ran Out of Food in the Last Year: Not on file  Transportation Needs:   .  Lack of Transportation (Medical): Not on file  . Lack of Transportation (Non-Medical): Not on file  Physical Activity:   . Days of Exercise per Week: Not on file  . Minutes of Exercise per Session: Not on file  Stress:   . Feeling of Stress : Not on file  Social Connections:   . Frequency of Communication with Friends and Family: Not on file  . Frequency of Social Gatherings with Friends and Family: Not on file  . Attends Religious Services: Not on file  . Active Member of Clubs or Organizations: Not on file  . Attends Archivist Meetings: Not on file  . Marital Status: Not on file  Intimate Partner Violence:   . Fear of Current or Ex-Partner: Not on file  . Emotionally Abused: Not on file  . Physically Abused: Not on file  . Sexually Abused: Not on file    Past Surgical History:  Procedure  Laterality Date  . INGUINAL HERNIA REPAIR Right   . TONSILLECTOMY        Current Outpatient Medications:  .  aspirin 81 MG tablet, Take 81 mg by mouth daily., Disp: , Rfl:  .  atorvastatin (LIPITOR) 20 MG tablet, Take 20 mg by mouth daily., Disp: , Rfl:  .  cholecalciferol (VITAMIN D3) 25 MCG (1000 UNIT) tablet, Take 1,000 Units by mouth daily., Disp: , Rfl:  .  denosumab (PROLIA) 60 MG/ML SOSY injection, Inject 60 mg into the skin every 6 (six) months., Disp: , Rfl:  .  nitroGLYCERIN (NITROSTAT) 0.4 MG SL tablet, Place 1 tablet (0.4 mg total) under the tongue every 5 (five) minutes as needed for chest pain., Disp: 25 tablet, Rfl: 11 .  Omega-3 Fatty Acids (FISH OIL) 1200 MG CAPS, Take 1 capsule by mouth daily., Disp: , Rfl:  .  Probiotic Product (PROBIOTIC DAILY PO), Take 1 tablet by mouth daily. , Disp: , Rfl:  .  pyridostigmine (MESTINON) 60 MG tablet, Take 1 tablet at 8am and 4pm daily., Disp: 180 tablet, Rfl: 3    Physical Exam: Blood pressure 136/70, pulse (!) 50, height 5\' 5"  (1.651 m), weight 133 lb (60.3 kg), SpO2 97 %.    Affect appropriate Healthy:  appears stated age 21: normal Neck supple with no adenopathy JVP normal no bruits no thyromegaly Lungs clear with no wheezing and good diaphragmatic motion Heart:  S1/S2 no murmur, no rub, gallop or click PMI normal Abdomen: benighn, BS positve, no tenderness, no AAA no bruit.  No HSM or HJR Distal pulses intact with no bruits No edema Neuro non-focal Skin warm and dry No muscular weakness   Labs:   Lab Results  Component Value Date   WBC 6.1 11/12/2011   HGB 14.7 11/12/2011   HCT 44.1 11/12/2011   MCV 93.8 11/12/2011   PLT 242 11/12/2011   No results for input(s): NA, K, CL, CO2, BUN, CREATININE, CALCIUM, PROT, BILITOT, ALKPHOS, ALT, AST, GLUCOSE in the last 168 hours.  Invalid input(s): LABALBU Lab Results  Component Value Date   CKTOTAL 112 11/11/2011   CKMB 2.3 11/11/2011   TROPONINI <0.30 11/11/2011     Lab Results  Component Value Date   CHOL 265 (H) 11/12/2011   Lab Results  Component Value Date   HDL 92 11/12/2011   Lab Results  Component Value Date   LDLCALC 155 (H) 11/12/2011   Lab Results  Component Value Date   TRIG 90 11/12/2011   Lab Results  Component Value Date  CHOLHDL 2.9 11/12/2011   No results found for: LDLDIRECT    Radiology: No results found.  EKG: SR rate 50 normal    ASSESSMENT AND PLAN:   1. CAD:  Historic by cardiac CT 2013 with high calcium score over 500 and non ischemic f/u myovue 11/12/11 some SSCP more consistent with muscular/pericardia will order exercise myovue Will also get echo and r/o inflammation recurrent effusion pericardial disease and assess EF 2. HLD:  Continue statin  3. Occular Myesthenia:  Continue mestinon   F/u in a year if myovue and echo ok   Signed: Jenkins Rouge 06/12/2020, 4:35 PM

## 2020-06-08 DIAGNOSIS — Z23 Encounter for immunization: Secondary | ICD-10-CM | POA: Diagnosis not present

## 2020-06-12 ENCOUNTER — Other Ambulatory Visit: Payer: Self-pay

## 2020-06-12 ENCOUNTER — Ambulatory Visit (INDEPENDENT_AMBULATORY_CARE_PROVIDER_SITE_OTHER): Payer: PPO | Admitting: Cardiovascular Disease

## 2020-06-12 VITALS — BP 136/70 | HR 50 | Ht 65.0 in | Wt 133.0 lb

## 2020-06-12 DIAGNOSIS — I2510 Atherosclerotic heart disease of native coronary artery without angina pectoris: Secondary | ICD-10-CM | POA: Diagnosis not present

## 2020-06-12 DIAGNOSIS — Z8679 Personal history of other diseases of the circulatory system: Secondary | ICD-10-CM

## 2020-06-12 DIAGNOSIS — R0600 Dyspnea, unspecified: Secondary | ICD-10-CM

## 2020-06-12 DIAGNOSIS — R0609 Other forms of dyspnea: Secondary | ICD-10-CM

## 2020-06-12 NOTE — Patient Instructions (Signed)
Medication Instructions:  *If you need a refill on your cardiac medications before your next appointment, please call your pharmacy*  Lab Work: If you have labs (blood work) drawn today and your tests are completely normal, you will receive your results only by: Marland Kitchen MyChart Message (if you have MyChart) OR . A paper copy in the mail If you have any lab test that is abnormal or we need to change your treatment, we will call you to review the results.  Testing/Procedures: Your physician has requested that you have en exercise stress myoview. For further information please visit [email or web address]. Please follow instruction sheet, as given.  Your physician has requested that you have an echocardiogram. Echocardiography is a painless test that uses sound waves to create images of your heart. It provides your doctor with information about the size and shape of your heart and how well your heart's chambers and valves are working. This procedure takes approximately one hour. There are no restrictions for this procedure.  Follow-Up: At [facility], you and your health needs are our priority.  As part of our continuing mission to provide you with exceptional heart care, we have created designated Provider Care Teams.  These Care Teams include your primary Cardiologist (physician) and Advanced Practice Providers (APPs -  Physician Assistants and Nurse Practitioners) who all work together to provide you with the care you need, when you need it.  We recommend signing up for the patient portal called "MyChart".  Sign up information is provided on this After Visit Summary.  MyChart is used to connect with patients for Virtual Visits (Telemedicine).  Patients are able to view lab/test results, encounter notes, upcoming appointments, etc.  Non-urgent messages can be sent to your provider as well.   To learn more about what you can do with MyChart, go to [email or web address].    Your next appointment:   6  month(s)  The format for your next appointment:   In Person  Provider:   You may see Dr. Johnsie Cancel or one of the following Advanced Practice Providers on your designated Care Team:    Truitt Merle, NP  Cecilie Kicks, NP  Kathyrn Drown, NP

## 2020-06-25 ENCOUNTER — Telehealth (HOSPITAL_COMMUNITY): Payer: Self-pay | Admitting: *Deleted

## 2020-06-25 ENCOUNTER — Encounter (HOSPITAL_COMMUNITY): Payer: Self-pay | Admitting: *Deleted

## 2020-06-25 ENCOUNTER — Other Ambulatory Visit (HOSPITAL_COMMUNITY)
Admission: RE | Admit: 2020-06-25 | Discharge: 2020-06-25 | Disposition: A | Discharge status: Home / Self Care | Payer: PPO | Source: Ambulatory Visit | Attending: Cardiovascular Disease | Admitting: Cardiovascular Disease

## 2020-06-25 DIAGNOSIS — Z01812 Encounter for preprocedural laboratory examination: Secondary | ICD-10-CM | POA: Diagnosis not present

## 2020-06-25 DIAGNOSIS — Z20822 Contact with and (suspected) exposure to covid-19: Secondary | ICD-10-CM | POA: Diagnosis not present

## 2020-06-25 LAB — SARS CORONAVIRUS 2 (TAT 6-24 HRS): SARS Coronavirus 2: NEGATIVE

## 2020-06-25 NOTE — Telephone Encounter (Signed)
Left message on voicemail in reference to upcoming appointment scheduled for 06/28/20. Phone number given for a call back so details instructions can be given. Sean Rosario

## 2020-06-25 NOTE — Telephone Encounter (Signed)
Sent instructions for stress test on 06/28/20 via MyChart.

## 2020-06-28 ENCOUNTER — Ambulatory Visit (HOSPITAL_COMMUNITY): Payer: PPO | Attending: Cardiology

## 2020-06-28 ENCOUNTER — Ambulatory Visit (HOSPITAL_BASED_OUTPATIENT_CLINIC_OR_DEPARTMENT_OTHER): Payer: PPO

## 2020-06-28 ENCOUNTER — Other Ambulatory Visit: Payer: Self-pay

## 2020-06-28 DIAGNOSIS — Z8679 Personal history of other diseases of the circulatory system: Secondary | ICD-10-CM

## 2020-06-28 DIAGNOSIS — R0609 Other forms of dyspnea: Secondary | ICD-10-CM

## 2020-06-28 DIAGNOSIS — I2510 Atherosclerotic heart disease of native coronary artery without angina pectoris: Secondary | ICD-10-CM | POA: Diagnosis not present

## 2020-06-28 DIAGNOSIS — R0600 Dyspnea, unspecified: Secondary | ICD-10-CM

## 2020-06-28 LAB — MYOCARDIAL PERFUSION IMAGING
Estimated workload: 10.1 METS
Exercise duration (min): 8 min
Exercise duration (sec): 30 s
LV dias vol: 69 mL (ref 62–150)
LV sys vol: 25 mL
MPHR: 145 {beats}/min
Peak HR: 139 {beats}/min
Percent HR: 95 %
Rest HR: 48 {beats}/min
SDS: 0
SRS: 0
SSS: 0
TID: 0.98

## 2020-06-28 LAB — ECHOCARDIOGRAM COMPLETE
Area-P 1/2: 3.42 cm2
Height: 65 in
S' Lateral: 2.5 cm
Weight: 2128 [oz_av]

## 2020-06-28 MED ORDER — TECHNETIUM TC 99M TETROFOSMIN IV KIT
9.4000 | PACK | Freq: Once | INTRAVENOUS | Status: AC | PRN
  Administered 2020-06-28: 9.4 via INTRAVENOUS
  Filled 2020-06-28: qty 10

## 2020-06-28 MED ORDER — TECHNETIUM TC 99M TETROFOSMIN IV KIT
33.0000 | PACK | Freq: Once | INTRAVENOUS | Status: AC | PRN
  Administered 2020-06-28: 33 via INTRAVENOUS
  Filled 2020-06-28: qty 33

## 2020-06-28 MED ORDER — PERFLUTREN LIPID MICROSPHERE
1.0000 mL | INTRAVENOUS | Status: AC | PRN
  Administered 2020-06-28: 2 mL via INTRAVENOUS

## 2020-07-18 DIAGNOSIS — M810 Age-related osteoporosis without current pathological fracture: Secondary | ICD-10-CM | POA: Diagnosis not present

## 2020-09-20 DIAGNOSIS — R300 Dysuria: Secondary | ICD-10-CM | POA: Diagnosis not present

## 2020-09-20 DIAGNOSIS — E7800 Pure hypercholesterolemia, unspecified: Secondary | ICD-10-CM | POA: Diagnosis not present

## 2020-09-20 DIAGNOSIS — Z125 Encounter for screening for malignant neoplasm of prostate: Secondary | ICD-10-CM | POA: Diagnosis not present

## 2020-09-20 DIAGNOSIS — R7301 Impaired fasting glucose: Secondary | ICD-10-CM | POA: Diagnosis not present

## 2020-09-20 DIAGNOSIS — M899 Disorder of bone, unspecified: Secondary | ICD-10-CM | POA: Diagnosis not present

## 2020-09-20 DIAGNOSIS — Z79899 Other long term (current) drug therapy: Secondary | ICD-10-CM | POA: Diagnosis not present

## 2020-09-25 DIAGNOSIS — Z79899 Other long term (current) drug therapy: Secondary | ICD-10-CM | POA: Diagnosis not present

## 2020-09-25 DIAGNOSIS — R7303 Prediabetes: Secondary | ICD-10-CM | POA: Diagnosis not present

## 2020-09-25 DIAGNOSIS — I2510 Atherosclerotic heart disease of native coronary artery without angina pectoris: Secondary | ICD-10-CM | POA: Diagnosis not present

## 2020-09-25 DIAGNOSIS — G7000 Myasthenia gravis without (acute) exacerbation: Secondary | ICD-10-CM | POA: Diagnosis not present

## 2020-09-25 DIAGNOSIS — L570 Actinic keratosis: Secondary | ICD-10-CM | POA: Diagnosis not present

## 2020-09-25 DIAGNOSIS — M810 Age-related osteoporosis without current pathological fracture: Secondary | ICD-10-CM | POA: Diagnosis not present

## 2020-09-25 DIAGNOSIS — R972 Elevated prostate specific antigen [PSA]: Secondary | ICD-10-CM | POA: Diagnosis not present

## 2020-09-25 DIAGNOSIS — Z0000 Encounter for general adult medical examination without abnormal findings: Secondary | ICD-10-CM | POA: Diagnosis not present

## 2020-09-25 DIAGNOSIS — M899 Disorder of bone, unspecified: Secondary | ICD-10-CM | POA: Diagnosis not present

## 2020-09-25 DIAGNOSIS — Z87898 Personal history of other specified conditions: Secondary | ICD-10-CM | POA: Diagnosis not present

## 2020-09-25 DIAGNOSIS — E7800 Pure hypercholesterolemia, unspecified: Secondary | ICD-10-CM | POA: Diagnosis not present

## 2020-09-26 DIAGNOSIS — H43811 Vitreous degeneration, right eye: Secondary | ICD-10-CM | POA: Diagnosis not present

## 2020-09-26 DIAGNOSIS — H52203 Unspecified astigmatism, bilateral: Secondary | ICD-10-CM | POA: Diagnosis not present

## 2020-09-26 DIAGNOSIS — H25813 Combined forms of age-related cataract, bilateral: Secondary | ICD-10-CM | POA: Diagnosis not present

## 2020-09-26 DIAGNOSIS — H532 Diplopia: Secondary | ICD-10-CM | POA: Diagnosis not present

## 2020-10-01 DIAGNOSIS — D225 Melanocytic nevi of trunk: Secondary | ICD-10-CM | POA: Diagnosis not present

## 2020-10-01 DIAGNOSIS — D044 Carcinoma in situ of skin of scalp and neck: Secondary | ICD-10-CM | POA: Diagnosis not present

## 2020-10-01 DIAGNOSIS — L570 Actinic keratosis: Secondary | ICD-10-CM | POA: Diagnosis not present

## 2020-10-01 DIAGNOSIS — X32XXXD Exposure to sunlight, subsequent encounter: Secondary | ICD-10-CM | POA: Diagnosis not present

## 2020-10-01 DIAGNOSIS — L821 Other seborrheic keratosis: Secondary | ICD-10-CM | POA: Diagnosis not present

## 2020-10-02 ENCOUNTER — Other Ambulatory Visit: Payer: Self-pay | Admitting: Family Medicine

## 2020-10-02 DIAGNOSIS — M810 Age-related osteoporosis without current pathological fracture: Secondary | ICD-10-CM

## 2020-10-29 DIAGNOSIS — N3281 Overactive bladder: Secondary | ICD-10-CM | POA: Diagnosis not present

## 2020-10-29 DIAGNOSIS — R350 Frequency of micturition: Secondary | ICD-10-CM | POA: Diagnosis not present

## 2020-10-29 DIAGNOSIS — Z8042 Family history of malignant neoplasm of prostate: Secondary | ICD-10-CM | POA: Diagnosis not present

## 2020-10-29 DIAGNOSIS — N401 Enlarged prostate with lower urinary tract symptoms: Secondary | ICD-10-CM | POA: Diagnosis not present

## 2020-10-29 DIAGNOSIS — R351 Nocturia: Secondary | ICD-10-CM | POA: Diagnosis not present

## 2020-10-29 DIAGNOSIS — R3915 Urgency of urination: Secondary | ICD-10-CM | POA: Diagnosis not present

## 2020-11-08 NOTE — Progress Notes (Incomplete)
CARDIOLOGY CONSULT NOTE       Patient ID: Sean Rosario MRN: [id number] DOB/AGE: Mar 18, 1946 75 y.o.  Admit date: (Not on file) Referring Physician: Posey Pronto Primary Physician: Hulan Fess, MD Primary Cardiologist: New Reason for Consultation: CAD/Atherosclerosis  Active Problems:   * No active hospital problems. *   HPI:  75 y.o. referred by Dr Posey Pronto 06/12/20  for CAD and atherosclerosis. Distant history of pericarditis over 20 years ago History of HLD on lipitor. History of occular myesthenia with some double vision He does take a baby aspirin daily   Had a coronary CT in 2013 calcium score was 524 82 nd percentile  Cors left dominant No disease thought to be obstructive No FFR CT available at that time F/U stress myovue at that time showed no ischemia and normal EF   He tends to be bradycardic no chronotropic incompetence on stress test above  Myovue 06/28/20 normal no ischemia EF 64%  Echo 06/28/20 EF 60-65% trivial MR no effusion   LDL 78 HDL 97  ***  ROS All other systems reviewed and negative except as noted above  Past Medical History:  Diagnosis Date  . Actinic keratoses   . Age related osteoporosis   . Atherosclerotic heart disease of native coronary artery with angina pectoris ([place])   . BPH (benign prostatic hyperplasia)   . CAD (coronary artery disease)   . Colon polyp, hyperplastic   . History of anemia   . Hypercholesterolemia   . Hyperlipidemia    Patient previously elected lifestyle changes in lieu of meds  . Impaired fasting glucose   . Myasthenia gravis    Only manifested by intermittent double vision. Maintained on mestinon  . Ocular myasthenia ([place])   . Osteoporosis   . Pericarditis    20 years ago    Family History  Problem Relation Age of Onset  . ALS Father        Deceased, 79  . Heart defect Father        2 valve surgeries  . Hypertension Mother   . Hypertension Sister   . Epilepsy Sister     Social History   Socioeconomic  History  . Marital status: Married    Spouse name: Not on file  . Number of children: Not on file  . Years of education: Not on file  . Highest education level: Not on file  Occupational History  . Not on file  Tobacco Use  . Smoking status: Never Smoker  . Smokeless tobacco: Never Used  Vaping Use  . Vaping Use: Never used  Substance and Sexual Activity  . Alcohol use: Yes    Alcohol/week: 10.0 standard drinks    Types: 10 Glasses of wine per week    Comment: 2 glasses of wine per night most, but not all nights  . Drug use: No  . Sexual activity: Not on file  Other Topics Concern  . Not on file  Social History Narrative   He is retired from [employer].   Lives with wife and mother (55).  They have two children.   Right handed   Two story home   Drinks caffeine   Social Determinants of Health   Financial Resource Strain: Not on file  Food Insecurity: Not on file  Transportation Needs: Not on file  Physical Activity: Not on file  Stress: Not on file  Social Connections: Not on file  Intimate Partner Violence: Not on file    Past Surgical History:  Procedure Laterality Date  . INGUINAL HERNIA REPAIR Right   . TONSILLECTOMY        Current Outpatient Medications:  .  aspirin 81 MG tablet, Take 81 mg by mouth daily., Disp: , Rfl:  .  atorvastatin (LIPITOR) 20 MG tablet, Take 20 mg by mouth daily., Disp: , Rfl:  .  cholecalciferol (VITAMIN D3) 25 MCG (1000 UNIT) tablet, Take 1,000 Units by mouth daily., Disp: , Rfl:  .  denosumab (PROLIA) 60 MG/ML SOSY injection, Inject 60 mg into the skin every 6 (six) months., Disp: , Rfl:  .  nitroGLYCERIN (NITROSTAT) 0.4 MG SL tablet, Place 1 tablet (0.4 mg total) under the tongue every 5 (five) minutes as needed for chest pain., Disp: 25 tablet, Rfl: 11 .  Omega-3 Fatty Acids (FISH OIL) 1200 MG CAPS, Take 1 capsule by mouth daily., Disp: , Rfl:  .  Probiotic Product (PROBIOTIC DAILY PO), Take 1 tablet by mouth daily. ,  Disp: , Rfl:  .  pyridostigmine (MESTINON) 60 MG tablet, Take 1 tablet at 8am and 4pm daily., Disp: 180 tablet, Rfl: 3    Physical Exam: There were no vitals taken for this visit.    Affect appropriate Healthy:  appears stated age 70: normal Neck supple with no adenopathy JVP normal no bruits no thyromegaly Lungs clear with no wheezing and good diaphragmatic motion Heart:  S1/S2 no murmur, no rub, gallop or click PMI normal Abdomen: benighn, BS positve, no tenderness, no AAA no bruit.  No HSM or HJR Distal pulses intact with no bruits No edema Neuro non-focal Skin warm and dry No muscular weakness   Labs:   Lab Results  Component Value Date   WBC 6.1 11/12/2011   HGB 14.7 11/12/2011   HCT 44.1 11/12/2011   MCV 93.8 11/12/2011   PLT 242 11/12/2011   No results for input(s): NA, K, CL, CO2, BUN, CREATININE, CALCIUM, PROT, BILITOT, ALKPHOS, ALT, AST, GLUCOSE in the last 168 hours.  Invalid input(s): LABALBU Lab Results  Component Value Date   CKTOTAL 112 11/11/2011   CKMB 2.3 11/11/2011   TROPONINI <0.30 11/11/2011    Lab Results  Component Value Date   CHOL 265 (H) 11/12/2011   Lab Results  Component Value Date   HDL 92 11/12/2011   Lab Results  Component Value Date   LDLCALC 155 (H) 11/12/2011   Lab Results  Component Value Date   TRIG 90 11/12/2011   Lab Results  Component Value Date   CHOLHDL 2.9 11/12/2011   No results found for: LDLDIRECT    Radiology: No results found.  EKG: SR rate 50 normal    ASSESSMENT AND PLAN:   1. CAD:  Historic by cardiac CT 2013 with high calcium score over 500 and non ischemic f/u myovue 11/12/11 and 06/28/20 medical Rx  2. HLD:  Continue statin  3. Occular Myesthenia:  Continue mestinon   F/u in a year   Signed: Jenkins Rouge 11/08/2020, 8:51 AM

## 2020-11-14 ENCOUNTER — Ambulatory Visit: Payer: PPO | Admitting: Cardiovascular Disease

## 2020-11-18 ENCOUNTER — Ambulatory Visit: Payer: PPO | Admitting: Cardiovascular Disease

## 2020-11-27 DIAGNOSIS — Z1211 Encounter for screening for malignant neoplasm of colon: Secondary | ICD-10-CM | POA: Diagnosis not present

## 2020-11-27 DIAGNOSIS — K5900 Constipation, unspecified: Secondary | ICD-10-CM | POA: Diagnosis not present

## 2020-12-15 DIAGNOSIS — S0990XA Unspecified injury of head, initial encounter: Secondary | ICD-10-CM | POA: Diagnosis not present

## 2020-12-15 DIAGNOSIS — S61412A Laceration without foreign body of left hand, initial encounter: Secondary | ICD-10-CM | POA: Diagnosis not present

## 2020-12-23 ENCOUNTER — Ambulatory Visit: Payer: PPO | Admitting: Neurology

## 2020-12-23 ENCOUNTER — Other Ambulatory Visit: Payer: Self-pay

## 2020-12-23 ENCOUNTER — Encounter: Payer: Self-pay | Admitting: Neurology

## 2020-12-23 VITALS — BP 121/74 | HR 60 | Ht 65.0 in | Wt 132.0 lb

## 2020-12-23 DIAGNOSIS — G7000 Myasthenia gravis without (acute) exacerbation: Secondary | ICD-10-CM

## 2020-12-23 MED ORDER — PYRIDOSTIGMINE BROMIDE 60 MG PO TABS
ORAL_TABLET | ORAL | 3 refills | Status: DC
Start: 2020-12-23 — End: 2021-12-24

## 2020-12-23 NOTE — Patient Instructions (Signed)
Return to clinic in 1 year.

## 2020-12-23 NOTE — Progress Notes (Signed)
Follow-up Visit   Date: 12/23/20    Sean Rosario MRN: [id number] DOB: Apr 13, 1946   Interim History: Sean Rosario is a 75 y.o. right-handed Caucasian male with history of ocular myasthenia (diagnosed in 2010, on mestinon 60mg  daily), osteoporesis, and hyperlipidemia returning for follow-up of ocular myasthenia gravis.  History of present illness: Symptoms started in 2009 with diplopia and was seen by Dr. Vallarie Mare at [facility] who performed single fiber EMG that was mildly abnormal. He does not recall that his antibodies were positive. He was diagnosed with ocular myasthenia in 2010 and was started on mestinon with no benefit.  Prednisone was slowly titrated to 20mg  for one year and diplopia resolved. He was doing well until 2013, when his diplopia returned. Due to osteoporosis, prednisone was not started, but a trial of IVIG was given without benefit. He was started on mestinon 60mg  daily which resolved symptoms.   He has never been hospitalized for myasthenia or showed signs of dysarthria, dysphagia, or limb weakness.  He was previously seeing Dr. [name] at [facility], but due to distance transitioned care here in January 2015.  He has been doing well on mestinon 60mg  daily and has not had any exacerbations.    UPDATE 12/23/2020:  He is here for 1 year follow-up.  Over the past year, he is more aware of double vision when he is tired, watching a play/production at the theater, or with night driving.  He typically takes mestinon 60mg  once daily in the morning.  No droopiness of the eyes, difficulty swallowing/talking, or limb weakness.   Medications:  Current Outpatient Medications on File Prior to Visit  Medication Sig Dispense Refill  . aspirin 81 MG tablet Take 81 mg by mouth daily.    Marland Kitchen atorvastatin (LIPITOR) 20 MG tablet Take 20 mg by mouth daily.    Marland Kitchen denosumab (PROLIA) 60 MG/ML SOSY injection Inject 60 mg into the skin every 6 (six) months.    . nitroGLYCERIN  (NITROSTAT) 0.4 MG SL tablet Place 1 tablet (0.4 mg total) under the tongue every 5 (five) minutes as needed for chest pain. 25 tablet 11  . Omega-3 Fatty Acids (FISH OIL) 1200 MG CAPS Take 1 capsule by mouth daily.    . Probiotic Product (PROBIOTIC DAILY PO) Take 1 tablet by mouth daily.     Marland Kitchen pyridostigmine (MESTINON) 60 MG tablet Take 1 tablet at 8am and 4pm daily. 180 tablet 3  . cholecalciferol (VITAMIN D3) 25 MCG (1000 UNIT) tablet Take 1,000 Units by mouth daily. (Patient not taking: Reported on 12/23/2020)     No current facility-administered medications on file prior to visit.    Allergies: No Known Allergies   Vital Signs:  BP 121/74   Pulse 60   Ht 5\' 5"  (1.651 m)   Wt 132 lb (59.9 kg)   SpO2 100%   BMI 21.97 kg/m    Neurological Exam: MENTAL STATUS including orientation to time, place, person, recent and remote memory, attention span and concentration, language, and fund of knowledge is normal.  Speech is not dysarthric.  CRANIAL NERVES:  Normal conjugate, extra-ocular eye movements in all directions of gaze.  Subtle left ptosis no worsening with sustained upgaze (old, stable).  Face is symmetric with normal motor strength testing of facial muscles.  Tongue is midline and strength is 5/5.  MOTOR:  Motor strength is 5/5 in all extremities, no fatigability.  Tone is normal.   REFLEXES:  2+/4 throughout  COORDINATION/GAIT:  Gait  narrow based and stable.   Data: Labs received from [facility] dated 09/25/2011: [place] antibody titer negative, acetylcholine binding antibody (<0.03) - negative  CT chest 10/20/2014: no evidence of thymoma  CT head 05/09/2008: No acute intracranial abnormality.   US carotids 05/09/2008: No significant carotid stenosis identified by duplex ultrasound. There is a mild amount of plaque present bilaterally. Estimated ICA stenoses are less than 50% bilaterally. Incidental cystic nodules in both lobes of the thyroid gland which appears small and  are likely benign.   IMPRESSION: Ocular seronegative myasthenia gravis, thymoma negative (dx 2020 on SFEMG), manifesting with diplopia.  Symptoms have remained ocular and he continues to have intermittent diplopia. He takes mestinon 60mg  daily.  He has previously been on prednisone 20mg  (stopped due to osteoporosis) and IVIG (no benefit).  We have decided to manage with mestinon alone.  - Continue mestinon 60mg  twice daily as needed.  Refills sent.  Return to clinic in 1 year  Thank you for allowing me to participate in patient's care.  If I can answer any additional questions, I would be pleased to do so.    Sincerely,    Travius Crochet K. Posey Pronto, DO

## 2021-01-16 DIAGNOSIS — M810 Age-related osteoporosis without current pathological fracture: Secondary | ICD-10-CM | POA: Diagnosis not present

## 2021-01-24 DIAGNOSIS — Z1211 Encounter for screening for malignant neoplasm of colon: Secondary | ICD-10-CM | POA: Diagnosis not present

## 2021-01-25 DIAGNOSIS — Z20822 Contact with and (suspected) exposure to covid-19: Secondary | ICD-10-CM | POA: Diagnosis not present

## 2021-01-27 DIAGNOSIS — U071 COVID-19: Secondary | ICD-10-CM | POA: Diagnosis not present

## 2021-01-29 ENCOUNTER — Other Ambulatory Visit: Payer: PPO

## 2021-03-17 NOTE — Progress Notes (Signed)
CARDIOLOGY CONSULT NOTE       Patient ID: Sean Rosario MRN: [id number] DOB/AGE: 76-Aug-1947 74 y.o.  Admit date: (Not on file) Referring Physician: Posey Pronto Primary Physician: Vernie Shanks, MD Primary Cardiologist: New Reason for Consultation: CAD/Atherosclerosis  Active Problems:   * No active hospital problems. *   HPI:  75 y.o. seen distantly in 2014 and 2017 Last evaluated on 06/12/20   Distant history of pericarditis over 20 years ago History of HLD on lipitor. History of occular myesthenia with some double vision   Had a coronary CT in 2013 calcium score was 524 82 nd percentile  Cors left dominant No disease thought to be obstructive No FFR CT available at that time F/U stress myovue 11/12/11  at that time showed no ischemia and normal EF Updated myovue done 06/28/20 also normal EF 64% no ischemia  TTE reviewed 06/28/20: EF 60-65% no effusion no constriction trivial MR   He tends to be bradycardic no chronotropic incompetence on stress test above  Walking a lot with no symptoms    ROS All other systems reviewed and negative except as noted above  Past Medical History:  Diagnosis Date   Actinic keratoses    Age related osteoporosis    Atherosclerotic heart disease of native coronary artery with angina pectoris (HCC)    BPH (benign prostatic hyperplasia)    CAD (coronary artery disease)    Colon polyp, hyperplastic    History of anemia    Hypercholesterolemia    Hyperlipidemia    Patient previously elected lifestyle changes in lieu of meds   Impaired fasting glucose    Myasthenia gravis    Only manifested by intermittent double vision. Maintained on mestinon   Ocular myasthenia ([place])    Osteoporosis    Pericarditis    20 years ago    Family History  Problem Relation Age of Onset   ALS Father        Deceased, 77   Heart defect Father        2 valve surgeries   Hypertension Mother    Hypertension Sister    Epilepsy Sister     Social History    Socioeconomic History   Marital status: Married    Spouse name: Not on file   Number of children: 2   Years of education: Not on file   Highest education level: Not on file  Occupational History   Not on file  Tobacco Use   Smoking status: Never   Smokeless tobacco: Never  Vaping Use   Vaping Use: Never used  Substance and Sexual Activity   Alcohol use: Yes    Alcohol/week: 10.0 standard drinks    Types: 10 Glasses of wine per week    Comment: 2 glasses of wine per night most, but not all nights   Drug use: No   Sexual activity: Not on file  Other Topics Concern   Not on file  Social History Narrative   He is retired from [employer].   Lives with wife and mother (86).  They have two children.   Right handed   Two story home   Drinks caffeine   Social Determinants of Health   Financial Resource Strain: Not on file  Food Insecurity: Not on file  Transportation Needs: Not on file  Physical Activity: Not on file  Stress: Not on file  Social Connections: Not on file  Intimate Partner Violence: Not on file    Past Surgical History:  Procedure Laterality Date   INGUINAL HERNIA REPAIR Right    TONSILLECTOMY        Current Outpatient Medications:    aspirin 81 MG tablet, Take 81 mg by mouth daily., Disp: , Rfl:    atorvastatin (LIPITOR) 20 MG tablet, Take 20 mg by mouth daily., Disp: , Rfl:    denosumab (PROLIA) 60 MG/ML SOSY injection, Inject 60 mg into the skin every 6 (six) months., Disp: , Rfl:    nitroGLYCERIN (NITROSTAT) 0.4 MG SL tablet, Place 1 tablet (0.4 mg total) under the tongue every 5 (five) minutes as needed for chest pain., Disp: 25 tablet, Rfl: 11   Omega-3 Fatty Acids (FISH OIL) 1200 MG CAPS, Take 1 capsule by mouth daily., Disp: , Rfl:    Probiotic Product (PROBIOTIC DAILY PO), Take 1 tablet by mouth daily. , Disp: , Rfl:    pyridostigmine (MESTINON) 60 MG tablet, Take 1 tablet at 8am and 4pm daily., Disp: 180 tablet, Rfl: 3   tamsulosin  (FLOMAX) 0.4 MG CAPS capsule, Take 0.4 mg by mouth daily. Take 0.4 mg by mouth daily., Disp: , Rfl:    cholecalciferol (VITAMIN D3) 25 MCG (1000 UNIT) tablet, Take 1,000 Units by mouth daily. (Patient not taking: No sig reported), Disp: , Rfl:     Physical Exam: Blood pressure 116/68, pulse (!) 53, height 5\' 5"  (1.651 m), weight 59.8 kg, SpO2 99 %.    Affect appropriate Healthy:  appears stated age 40: normal Neck supple with no adenopathy JVP normal no bruits no thyromegaly Lungs clear with no wheezing and good diaphragmatic motion Heart:  S1/S2 no murmur, no rub, gallop or click PMI normal Abdomen: benighn, BS positve, no tenderness, no AAA no bruit.  No HSM or HJR Distal pulses intact with no bruits No edema Neuro non-focal Skin warm and dry No muscular weakness   Labs:   Lab Results  Component Value Date   WBC 6.1 11/12/2011   HGB 14.7 11/12/2011   HCT 44.1 11/12/2011   MCV 93.8 11/12/2011   PLT 242 11/12/2011   No results for input(s): NA, K, CL, CO2, BUN, CREATININE, CALCIUM, PROT, BILITOT, ALKPHOS, ALT, AST, GLUCOSE in the last 168 hours.  Invalid input(s): LABALBU Lab Results  Component Value Date   CKTOTAL 112 11/11/2011   CKMB 2.3 11/11/2011   TROPONINI <0.30 11/11/2011    Lab Results  Component Value Date   CHOL 265 (H) 11/12/2011   Lab Results  Component Value Date   HDL 92 11/12/2011   Lab Results  Component Value Date   LDLCALC 155 (H) 11/12/2011   Lab Results  Component Value Date   TRIG 90 11/12/2011   Lab Results  Component Value Date   CHOLHDL 2.9 11/12/2011   No results found for: LDLDIRECT    Radiology: No results found.  EKG: SR rate 50 normal 03/24/2021 SR rate 78 poor R wave progression w   ASSESSMENT AND PLAN:   1. CAD:  Historic by cardiac CT 2013 with high calcium score over 500 and non ischemic f/u myovue 11/12/11 and again in 06/28/20 continue medical Rx  2. HLD:  Continue statin   3. Occular Myesthenia:   Diagnosed 2010  Sees neurology Dr Posey Pronto Continue mestinon No benefit from IVIG and prednisone stopped due to osteoporosis  F/u in a year    Signed: Jenkins Rouge 03/24/2021, 9:27 AM

## 2021-03-21 DIAGNOSIS — R351 Nocturia: Secondary | ICD-10-CM | POA: Diagnosis not present

## 2021-03-21 DIAGNOSIS — Z8042 Family history of malignant neoplasm of prostate: Secondary | ICD-10-CM | POA: Diagnosis not present

## 2021-03-21 DIAGNOSIS — N401 Enlarged prostate with lower urinary tract symptoms: Secondary | ICD-10-CM | POA: Diagnosis not present

## 2021-03-21 DIAGNOSIS — R350 Frequency of micturition: Secondary | ICD-10-CM | POA: Diagnosis not present

## 2021-03-21 DIAGNOSIS — R3915 Urgency of urination: Secondary | ICD-10-CM | POA: Diagnosis not present

## 2021-03-24 ENCOUNTER — Encounter: Payer: Self-pay | Admitting: Cardiovascular Disease

## 2021-03-24 ENCOUNTER — Other Ambulatory Visit: Payer: Self-pay

## 2021-03-24 ENCOUNTER — Ambulatory Visit: Payer: PPO | Admitting: Cardiovascular Disease

## 2021-03-24 VITALS — BP 116/68 | HR 53 | Ht 65.0 in | Wt 131.8 lb

## 2021-03-24 DIAGNOSIS — E782 Mixed hyperlipidemia: Secondary | ICD-10-CM | POA: Diagnosis not present

## 2021-03-24 DIAGNOSIS — I2510 Atherosclerotic heart disease of native coronary artery without angina pectoris: Secondary | ICD-10-CM

## 2021-03-24 DIAGNOSIS — G7000 Myasthenia gravis without (acute) exacerbation: Secondary | ICD-10-CM | POA: Diagnosis not present

## 2021-03-24 NOTE — Addendum Note (Signed)
Addended by: Jacinta Shoe on: 03/24/2021 04:01 PM   Modules accepted: Orders

## 2021-03-24 NOTE — Patient Instructions (Signed)
Medication Instructions:  Your physician recommends that you continue on your current medications as directed. Please refer to the Current Medication list given to you today.  *If you need a refill on your cardiac medications before your next appointment, please call your pharmacy*   Lab Work: If you have labs (blood work) drawn today and your tests are completely normal, you will receive your results only by: [place] (if you have MyChart) OR A paper copy in the mail If you have any lab test that is abnormal or we need to change your treatment, we will call you to review the results.  Follow-Up: At [facility], you and your health needs are our priority.  As part of our continuing mission to provide you with exceptional heart care, we have created designated Provider Care Teams.  These Care Teams include your primary Cardiologist (physician) and Advanced Practice Providers (APPs -  Physician Assistants and Nurse Practitioners) who all work together to provide you with the care you need, when you need it.  We recommend signing up for the patient portal called "MyChart".  Sign up information is provided on this After Visit Summary.  MyChart is used to connect with patients for Virtual Visits (Telemedicine).  Patients are able to view lab/test results, encounter notes, upcoming appointments, etc.  Non-urgent messages can be sent to your provider as well.   To learn more about what you can do with MyChart, go to [email or web address].    Your next appointment:   12 month(s)  The format for your next appointment:   In Person  Provider:   You may see Dr. Johnsie Cancel or one of the following Advanced Practice Providers on your designated Care Team:   Cecilie Kicks, NP

## 2021-04-09 DIAGNOSIS — R3915 Urgency of urination: Secondary | ICD-10-CM | POA: Diagnosis not present

## 2021-04-09 DIAGNOSIS — N401 Enlarged prostate with lower urinary tract symptoms: Secondary | ICD-10-CM | POA: Diagnosis not present

## 2021-04-09 DIAGNOSIS — R350 Frequency of micturition: Secondary | ICD-10-CM | POA: Diagnosis not present

## 2021-04-09 DIAGNOSIS — N3281 Overactive bladder: Secondary | ICD-10-CM | POA: Diagnosis not present

## 2021-05-06 DIAGNOSIS — G7000 Myasthenia gravis without (acute) exacerbation: Secondary | ICD-10-CM | POA: Diagnosis not present

## 2021-05-06 DIAGNOSIS — Z7184 Encounter for health counseling related to travel: Secondary | ICD-10-CM | POA: Diagnosis not present

## 2021-05-06 DIAGNOSIS — M810 Age-related osteoporosis without current pathological fracture: Secondary | ICD-10-CM | POA: Diagnosis not present

## 2021-05-06 DIAGNOSIS — N400 Enlarged prostate without lower urinary tract symptoms: Secondary | ICD-10-CM | POA: Diagnosis not present

## 2021-05-06 DIAGNOSIS — I2510 Atherosclerotic heart disease of native coronary artery without angina pectoris: Secondary | ICD-10-CM | POA: Diagnosis not present

## 2021-05-06 DIAGNOSIS — E7800 Pure hypercholesterolemia, unspecified: Secondary | ICD-10-CM | POA: Diagnosis not present

## 2021-06-23 ENCOUNTER — Other Ambulatory Visit: Payer: PPO

## 2021-06-26 ENCOUNTER — Other Ambulatory Visit: Payer: Self-pay

## 2021-06-26 ENCOUNTER — Ambulatory Visit
Admission: RE | Admit: 2021-06-26 | Discharge: 2021-06-26 | Disposition: A | Discharge status: Home / Self Care | Payer: PPO | Source: Ambulatory Visit | Attending: Family Medicine | Admitting: Family Medicine

## 2021-06-26 DIAGNOSIS — M85851 Other specified disorders of bone density and structure, right thigh: Secondary | ICD-10-CM | POA: Diagnosis not present

## 2021-06-26 DIAGNOSIS — M810 Age-related osteoporosis without current pathological fracture: Secondary | ICD-10-CM | POA: Diagnosis not present

## 2021-07-10 ENCOUNTER — Encounter (HOSPITAL_BASED_OUTPATIENT_CLINIC_OR_DEPARTMENT_OTHER): Payer: Self-pay

## 2021-07-10 ENCOUNTER — Other Ambulatory Visit: Payer: Self-pay

## 2021-07-10 ENCOUNTER — Emergency Department (HOSPITAL_BASED_OUTPATIENT_CLINIC_OR_DEPARTMENT_OTHER)
Admission: EM | Admit: 2021-07-10 | Discharge: 2021-07-10 | Disposition: A | Discharge status: Home / Self Care | Payer: PPO | Attending: Emergency Medicine | Admitting: Emergency Medicine

## 2021-07-10 DIAGNOSIS — M436 Torticollis: Secondary | ICD-10-CM | POA: Insufficient documentation

## 2021-07-10 DIAGNOSIS — Z7982 Long term (current) use of aspirin: Secondary | ICD-10-CM | POA: Insufficient documentation

## 2021-07-10 DIAGNOSIS — I25119 Atherosclerotic heart disease of native coronary artery with unspecified angina pectoris: Secondary | ICD-10-CM | POA: Diagnosis not present

## 2021-07-10 DIAGNOSIS — M542 Cervicalgia: Secondary | ICD-10-CM | POA: Diagnosis not present

## 2021-07-10 MED ORDER — METHOCARBAMOL 500 MG PO TABS: 500.0000 mg | ORAL_TABLET | Freq: Two times a day (BID) | ORAL | 0 refills | Status: DC

## 2021-07-10 MED ORDER — METHOCARBAMOL 500 MG PO TABS
750.0000 mg | ORAL_TABLET | Freq: Once | ORAL | Status: AC
  Administered 2021-07-10: 750 mg via ORAL
  Filled 2021-07-10: qty 2

## 2021-07-10 MED ORDER — KETOROLAC TROMETHAMINE 60 MG/2ML IM SOLN
30.0000 mg | Freq: Once | INTRAMUSCULAR | Status: AC
  Administered 2021-07-10: 30 mg via INTRAMUSCULAR
  Filled 2021-07-10: qty 2

## 2021-07-10 NOTE — ED Provider Notes (Signed)
[facility] EMERGENCY DEPT Provider Note  CSN: [id number] Arrival date & time: 07/10/21 0417  Chief Complaint(s) Neck Pain  HPI Sean Rosario is a 75 y.o. male    Neck Pain Pain location:  L side Quality:  Stiffness Stiffness is present:  All day Pain radiates to:  L shoulder, L arm and head Pain severity:  Severe Onset quality:  Gradual Duration:  2 days Timing:  Constant Progression:  Waxing and waning Chronicity:  New Context comment:  Started after doing yard work Relieved by:  Position Worsened by:  Twisting, position and bending Ineffective treatments:  Analgesics Associated symptoms: no chest pain, no leg pain, no numbness, no paresis, no photophobia, no visual change and no weakness   Risk factors: no recurrent falls    Past Medical History Past Medical History:  Diagnosis Date   Actinic keratoses    Age related osteoporosis    Atherosclerotic heart disease of native coronary artery with angina pectoris (HCC)    BPH (benign prostatic hyperplasia)    CAD (coronary artery disease)    Colon polyp, hyperplastic    History of anemia    Hypercholesterolemia    Hyperlipidemia    Patient previously elected lifestyle changes in lieu of meds   Impaired fasting glucose    Myasthenia gravis    Only manifested by intermittent double vision. Maintained on mestinon   Ocular myasthenia ([place])    Osteoporosis    Pericarditis    20 years ago   Patient Active Problem List   Diagnosis Date Noted   Ocular myasthenia gravis ([place]) 10/06/2013   Leg pain 08/21/2013   Lactic acidosis 08/21/2013   Myasthenia gravis ([place]) 08/21/2013   Coronary artery disease, non-occlusive 01/13/2012   Myasthenia gravis    Osteoporosis    Hyperlipidemia    Home Medication(s) Prior to Admission medications   Medication Sig Start Date End Date Taking? Authorizing Provider  methocarbamol (ROBAXIN) 500 MG tablet Take 1 tablet (500 mg total) by mouth 2 (two) times daily.  07/10/21  Yes Yavier Snider, Grayce Sessions, MD  aspirin 81 MG tablet Take 81 mg by mouth daily.    [provider]  atorvastatin (LIPITOR) 20 MG tablet Take 20 mg by mouth daily.    [provider]  cholecalciferol (VITAMIN D3) 25 MCG (1000 UNIT) tablet Take 1,000 Units by mouth daily. Patient not taking: No sig reported    [provider]  denosumab (PROLIA) 60 MG/ML SOSY injection Inject 60 mg into the skin every 6 (six) months.    [provider]  nitroGLYCERIN (NITROSTAT) 0.4 MG SL tablet Place 1 tablet (0.4 mg total) under the tongue every 5 (five) minutes as needed for chest pain. 01/13/12   Wall, Marijo Conception, MD  Omega-3 Fatty Acids (FISH OIL) 1200 MG CAPS Take 1 capsule by mouth daily.    [provider]  Probiotic Product (PROBIOTIC DAILY PO) Take 1 tablet by mouth daily.     [provider]  pyridostigmine (MESTINON) 60 MG tablet Take 1 tablet at 8am and 4pm daily. 12/23/20   Narda Amber K, DO  tamsulosin (FLOMAX) 0.4 MG CAPS capsule Take 0.4 mg by mouth daily. Take 0.4 mg by mouth daily. 03/21/21   [provider]  Past Surgical History Past Surgical History:  Procedure Laterality Date   INGUINAL HERNIA REPAIR Right    TONSILLECTOMY     Family History Family History  Problem Relation Age of Onset   ALS Father        Deceased, 67   Heart defect Father        2 valve surgeries   Hypertension Mother    Hypertension Sister    Epilepsy Sister     Social History Social History   Tobacco Use   Smoking status: Never   Smokeless tobacco: Never  Vaping Use   Vaping Use: Never used  Substance Use Topics   Alcohol use: Yes    Alcohol/week: 10.0 standard drinks    Types: 10 Glasses of wine per week    Comment: 2 glasses of wine per night most, but not all nights   Drug use: No    Allergies Patient has no known allergies.  Review of Systems Review of Systems  Eyes:  Negative for photophobia.  Cardiovascular:  Negative for chest pain.  Musculoskeletal:  Positive for neck pain.  Neurological:  Negative for weakness and numbness.  All other systems are reviewed and are negative for acute change except as noted in the HPI  Physical Exam Vital Signs  I have reviewed the triage vital signs BP (!) 161/90 (BP Location: Right Arm)   Pulse 63   Temp 98.3 F (36.8 C) (Oral)   Resp 16   Ht 5\' 5"  (1.651 m)   Wt 59.9 kg   SpO2 100%   BMI 21.97 kg/m   Physical Exam Vitals reviewed.  Constitutional:      General: Sean Rosario is not in acute distress.    Appearance: Sean Rosario is well-developed. Sean Rosario is not diaphoretic.  HENT:     Head: Normocephalic and atraumatic.     Right Ear: External ear normal.     Left Ear: External ear normal.     Nose: Nose normal.     Mouth/Throat:     Mouth: Mucous membranes are moist.  Eyes:     General: No scleral icterus.    Conjunctiva/sclera: Conjunctivae normal.  Neck:     Trachea: Phonation normal.   Cardiovascular:     Rate and Rhythm: Normal rate and regular rhythm.  Pulmonary:     Effort: Pulmonary effort is normal. No respiratory distress.     Breath sounds: No stridor.  Abdominal:     General: There is no distension.  Musculoskeletal:     Cervical back: Torticollis present. Muscular tenderness present. No spinous process tenderness. Decreased range of motion.  Neurological:     Mental Status: Sean Rosario is alert and oriented to person, place, and time.     Comments: Mental Status:  Alert and oriented to person, place, and time.  Attention and concentration normal.  Speech clear.  Recent memory is intact  Cranial Nerves:  II Visual Fields: Intact to confrontation. Visual fields intact. III, IV, VI: Pupils equal and reactive to light and near. Full eye movement without nystagmus  V Facial Sensation: Normal. No weakness of  masticatory muscles  VII: No facial weakness or asymmetry  VIII Auditory Acuity: Grossly normal  IX/X: The uvula is midline; the palate elevates symmetrically  XI: Normal sternocleidomastoid and trapezius strength  XII: The tongue is midline. No atrophy or fasciculations.   Motor System: Muscle Strength: 5/5 and symmetric in the upper and lower extremities. No pronation or drift.  Muscle Tone: Tone and muscle bulk are  normal in the upper and lower extremities.  Reflexes:  No Clonus Coordination: Intact finger-to-nose.No tremor.  Sensation: Intact to light touch. Gait: Routine gait normal.   Psychiatric:        Behavior: Behavior normal.    ED Results and Treatments Labs (all labs ordered are listed, but only abnormal results are displayed) Labs Reviewed - No data to display                                                                                                                       EKG  EKG Interpretation  Date/Time:    Ventricular Rate:    PR Interval:    QRS Duration:   QT Interval:    QTC Calculation:   R Axis:     Text Interpretation:         Radiology No results found.  Pertinent labs & imaging results that were available during my care of the patient were reviewed by me and considered in my medical decision making (see MDM for details).  Medications Ordered in ED Medications  ketorolac (TORADOL) injection 30 mg (30 mg Intramuscular Given 07/10/21 0515)  methocarbamol (ROBAXIN) tablet 750 mg (750 mg Oral Given 07/10/21 0516)                                                                                                                                     Procedures Procedures  (including critical care time)  Medical Decision Making / ED Course I have reviewed the nursing notes for this encounter and the patient's prior records (if available in EHR or on provided paperwork).  Sean Rosario was evaluated in Emergency Department on 07/10/2021 for the  symptoms described in the history of present illness. Sean Rosario was evaluated in the context of the global COVID-19 pandemic, which necessitated consideration that the patient might be at risk for infection with the SARS-CoV-2 virus that causes COVID-19. Institutional protocols and algorithms that pertain to the evaluation of patients at risk for COVID-19 are in a state of rapid change based on information released by regulatory bodies including the CDC and federal and state organizations. These policies and algorithms were followed during the patient's care in the ED.     Consistent with left sided torticollis. No neuro deficits. No chest pain. Doubt cardiovascular process.   Pertinent labs & imaging results that were available during my care of the patient were  reviewed by me and considered in my medical decision making:  Treated with IM toradol and oral robaxin. Patient already has appointment with Ortho.  Final Clinical Impression(s) / ED Diagnoses Final diagnoses:  Torticollis   The patient appears reasonably screened and/or stabilized for discharge and I doubt any other medical condition or other [facility] requiring further screening, evaluation, or treatment in the ED at this time prior to discharge. Safe for discharge with strict return precautions.  Disposition: Discharge  Condition: Good  I have discussed the results, Dx and Tx plan with the patient/family who expressed understanding and agree(s) with the plan. Discharge instructions discussed at length. The patient/family was given strict return precautions who verbalized understanding of the instructions. No further questions at time of discharge.    ED Discharge Orders          Ordered    methocarbamol (ROBAXIN) 500 MG tablet  2 times daily        07/10/21 0524              Follow Up: Vernie Shanks, MD [place] [address] [phone]  Call  to schedule an appointment for close follow  up    This chart was dictated using voice recognition software.  Despite best efforts to proofread,  errors can occur which can change the documentation meaning.    Fatima Blank, MD 07/10/21 [phone]

## 2021-07-10 NOTE — Discharge Instructions (Signed)
You may use over-the-counter Motrin (Ibuprofen), Acetaminophen (Tylenol), topical muscle creams such as SalonPas, Icy Hot, Bengay, etc. Please stretch, apply ice or heat (whichever helps), and have massage therapy for additional assistance.  

## 2021-07-10 NOTE — ED Triage Notes (Signed)
Patient here POV from Home for Neck Pain.  Pain began approximately 2 days PTA and has worsened.  NAD Noted during Triage. A&Ox4. GCS 15. A&Ox4. GCS 15. No N/V/D. No CP. No SOB.

## 2021-07-21 DIAGNOSIS — M542 Cervicalgia: Secondary | ICD-10-CM | POA: Diagnosis not present

## 2021-07-29 DIAGNOSIS — M810 Age-related osteoporosis without current pathological fracture: Secondary | ICD-10-CM | POA: Diagnosis not present

## 2021-09-24 DIAGNOSIS — E7800 Pure hypercholesterolemia, unspecified: Secondary | ICD-10-CM | POA: Diagnosis not present

## 2021-09-24 DIAGNOSIS — R7303 Prediabetes: Secondary | ICD-10-CM | POA: Diagnosis not present

## 2021-09-24 DIAGNOSIS — D649 Anemia, unspecified: Secondary | ICD-10-CM | POA: Diagnosis not present

## 2021-09-24 DIAGNOSIS — Z125 Encounter for screening for malignant neoplasm of prostate: Secondary | ICD-10-CM | POA: Diagnosis not present

## 2021-09-24 DIAGNOSIS — M810 Age-related osteoporosis without current pathological fracture: Secondary | ICD-10-CM | POA: Diagnosis not present

## 2021-09-26 DIAGNOSIS — M810 Age-related osteoporosis without current pathological fracture: Secondary | ICD-10-CM | POA: Diagnosis not present

## 2021-09-26 DIAGNOSIS — Z0000 Encounter for general adult medical examination without abnormal findings: Secondary | ICD-10-CM | POA: Diagnosis not present

## 2021-09-26 DIAGNOSIS — R7303 Prediabetes: Secondary | ICD-10-CM | POA: Diagnosis not present

## 2021-09-26 DIAGNOSIS — E7800 Pure hypercholesterolemia, unspecified: Secondary | ICD-10-CM | POA: Diagnosis not present

## 2021-09-26 DIAGNOSIS — D649 Anemia, unspecified: Secondary | ICD-10-CM | POA: Diagnosis not present

## 2021-10-01 DIAGNOSIS — H43811 Vitreous degeneration, right eye: Secondary | ICD-10-CM | POA: Diagnosis not present

## 2021-10-01 DIAGNOSIS — H5213 Myopia, bilateral: Secondary | ICD-10-CM | POA: Diagnosis not present

## 2021-10-01 DIAGNOSIS — H2513 Age-related nuclear cataract, bilateral: Secondary | ICD-10-CM | POA: Diagnosis not present

## 2021-10-01 DIAGNOSIS — G7000 Myasthenia gravis without (acute) exacerbation: Secondary | ICD-10-CM | POA: Diagnosis not present

## 2021-10-14 DIAGNOSIS — R3915 Urgency of urination: Secondary | ICD-10-CM | POA: Diagnosis not present

## 2021-10-14 DIAGNOSIS — N401 Enlarged prostate with lower urinary tract symptoms: Secondary | ICD-10-CM | POA: Diagnosis not present

## 2021-10-14 DIAGNOSIS — R350 Frequency of micturition: Secondary | ICD-10-CM | POA: Diagnosis not present

## 2021-12-24 ENCOUNTER — Ambulatory Visit: Payer: PPO | Admitting: Neurology

## 2021-12-24 ENCOUNTER — Encounter: Payer: Self-pay | Admitting: Neurology

## 2021-12-24 VITALS — BP 132/71 | HR 53 | Ht 65.0 in | Wt 132.0 lb

## 2021-12-24 DIAGNOSIS — G7000 Myasthenia gravis without (acute) exacerbation: Secondary | ICD-10-CM | POA: Diagnosis not present

## 2021-12-24 MED ORDER — PYRIDOSTIGMINE BROMIDE 60 MG PO TABS: ORAL_TABLET | ORAL | 3 refills | Status: DC

## 2021-12-24 NOTE — Progress Notes (Signed)
? ? ?Follow-up Visit ? ? ?Date: 12/24/21 ?  ? ?Sean Rosario ?MRN: [id number] ?DOB: 01-14-1946 ? ? ?Interim History: ?Sean Rosario is a 76 y.o. right-handed Caucasian male with history of ocular myasthenia (diagnosed in 2010, on mestinon '60mg'$  daily), osteoporesis, and hyperlipidemia returning for follow-up of ocular myasthenia gravis. ? ?History of present illness: ?Symptoms started in 2009 with diplopia and was seen by Dr. Vallarie Mare at [facility] who performed single fiber EMG that was mildly abnormal. He does not recall that his antibodies were positive. He was diagnosed with ocular myasthenia in 2010 and was started on mestinon with no benefit.  Prednisone was slowly titrated to '20mg'$  for one year and diplopia resolved. He was doing well until 2013, when his diplopia returned. Due to osteoporosis, prednisone was not started, but a trial of IVIG was given without benefit. He was started on mestinon '60mg'$  daily which resolved symptoms.  ? ?He has never been hospitalized for myasthenia or showed signs of dysarthria, dysphagia, or limb weakness.  ?He was previously seeing Dr. [name] at [facility], but due to distance transitioned care here in January 2015.  He has been doing well on mestinon '60mg'$  daily and has not had any exacerbations.   ? ?UPDATE 12/24/2021:  He is here for 1 year visit.  Myasthenia has been well-controlled on mestinon '60mg'$  daily.  Sometimes in the evening, such as after watching TV or prolonged driving, he feels that double vision is worse.  He enjoys reading in the evening and his blurred vision interferes with this.  No droopiness of the eyelids, difficulty with speech/swallow, or limb weakness.  No interval falls, hospitalizations, or illnesses.  ? ?Medications:  ?Current Outpatient Medications on File Prior to Visit  ?Medication Sig Dispense Refill  ? aspirin 81 MG tablet Take 81 mg by mouth daily.    ? atorvastatin (LIPITOR) 20 MG tablet Take 20 mg by mouth daily.    ? denosumab  (PROLIA) 60 MG/ML SOSY injection Inject 60 mg into the skin every 6 (six) months.    ? nitroGLYCERIN (NITROSTAT) 0.4 MG SL tablet Place 1 tablet (0.4 mg total) under the tongue every 5 (five) minutes as needed for chest pain. 25 tablet 11  ? Omega-3 Fatty Acids (FISH OIL) 1200 MG CAPS Take 1 capsule by mouth daily.    ? Probiotic Product (PROBIOTIC DAILY PO) Take 1 tablet by mouth daily.     ? pyridostigmine (MESTINON) 60 MG tablet Take 1 tablet at 8am and 4pm daily. (Patient taking differently: Take 1 tablet at 8am and 4pm daily. ? ?Patient takes once a day) 180 tablet 3  ? tamsulosin (FLOMAX) 0.4 MG CAPS capsule Take 0.4 mg by mouth daily. Take 0.4 mg by mouth daily.    ? cholecalciferol (VITAMIN D3) 25 MCG (1000 UNIT) tablet Take 1,000 Units by mouth daily. (Patient not taking: Reported on 12/24/2021)    ? methocarbamol (ROBAXIN) 500 MG tablet Take 1 tablet (500 mg total) by mouth 2 (two) times daily. (Patient not taking: Reported on 12/24/2021) 20 tablet 0  ? ?No current facility-administered medications on file prior to visit.  ? ? ?Allergies: No Known Allergies ? ? ?Vital Signs:  ?BP 132/71   Pulse (!) 53   Ht '5\' 5"'$  (1.651 m)   Wt 132 lb (59.9 kg)   SpO2 99%   BMI 21.97 kg/m?  ? ? ?Neurological Exam: ?MENTAL STATUS including orientation to time, place, person, recent and remote memory, attention span and concentration, language,  and fund of knowledge is normal.  Speech is not dysarthric. ? ?CRANIAL NERVES:  Normal conjugate, extra-ocular eye movements in all directions of gaze.  Subtle left ptosis no worsening with sustained upgaze (old, stable).  Face is symmetric with normal motor strength testing of facial muscles.  Tongue is midline and strength is 5/5. ? ?MOTOR:  Motor strength is 5/5 in all extremities, no fatigability.  Tone is normal.  ? ?COORDINATION/GAIT:  Gait narrow based and stable.  ? ?Data: ?Labs received from [facility] dated 09/25/2011: [place] antibody titer negative, acetylcholine  binding antibody (<0.03) - negative ? ?CT chest 10/20/2014: no evidence of thymoma  ?CT head 05/09/2008: No acute intracranial abnormality.  ? ?US carotids 05/09/2008: No significant carotid stenosis identified by duplex ultrasound. There is a mild amount of plaque present bilaterally. Estimated ICA stenoses are less than 50% bilaterally. Incidental cystic nodules in both lobes of the thyroid gland which appears small and are likely benign.  ? ?IMPRESSION: ?Ocular seronegative myasthenia gravis, thymoma negative (dx 2020 on SFEMG) manifesting with diplopia.  Clinically, he always has some mild diplopia, worse in the evening.  He currently takes mestinon '60mg'$  daily and has previously been on prednisone '20mg'$  (stopped due to osteoporosis) and IVIG (no benefit).  Due to pure ocular symptoms, we have opted to manage with mestinon alone.  I recommend increasing mestinon '60mg'$  to twice daily at 8a and 4p.  He may take an extra dose as needed. ? ?Return to clinic in 1 year ? ?Thank you for allowing me to participate in patient's care.  If I can answer any additional questions, I would be pleased to do so.   ? ?Sincerely, ? ? ? ?Lashonta Pilling K. Posey Pronto, DO ? ? ? ?

## 2021-12-24 NOTE — Patient Instructions (Signed)
Increase mestinon '60mg'$  to twice daily at 8a and 4p.  OK to take an extra dose as needed ? ?Return to clinic in 1 year ?

## 2022-02-03 DIAGNOSIS — M810 Age-related osteoporosis without current pathological fracture: Secondary | ICD-10-CM | POA: Diagnosis not present

## 2022-03-10 DIAGNOSIS — L570 Actinic keratosis: Secondary | ICD-10-CM | POA: Diagnosis not present

## 2022-03-10 DIAGNOSIS — X32XXXD Exposure to sunlight, subsequent encounter: Secondary | ICD-10-CM | POA: Diagnosis not present

## 2022-03-10 DIAGNOSIS — D225 Melanocytic nevi of trunk: Secondary | ICD-10-CM | POA: Diagnosis not present

## 2022-03-10 DIAGNOSIS — C4441 Basal cell carcinoma of skin of scalp and neck: Secondary | ICD-10-CM | POA: Diagnosis not present

## 2022-03-26 DIAGNOSIS — R7303 Prediabetes: Secondary | ICD-10-CM | POA: Diagnosis not present

## 2022-03-26 DIAGNOSIS — N400 Enlarged prostate without lower urinary tract symptoms: Secondary | ICD-10-CM | POA: Diagnosis not present

## 2022-03-26 DIAGNOSIS — M810 Age-related osteoporosis without current pathological fracture: Secondary | ICD-10-CM | POA: Diagnosis not present

## 2022-03-26 DIAGNOSIS — I2510 Atherosclerotic heart disease of native coronary artery without angina pectoris: Secondary | ICD-10-CM | POA: Diagnosis not present

## 2022-03-26 DIAGNOSIS — G7000 Myasthenia gravis without (acute) exacerbation: Secondary | ICD-10-CM | POA: Diagnosis not present

## 2022-05-21 ENCOUNTER — Ambulatory Visit: Payer: PPO | Attending: Physician Assistant | Admitting: Physician Assistant

## 2022-05-21 ENCOUNTER — Encounter: Payer: Self-pay | Admitting: Physician Assistant

## 2022-05-21 VITALS — BP 130/80 | HR 49 | Ht 65.0 in | Wt 133.4 lb

## 2022-05-21 DIAGNOSIS — I2510 Atherosclerotic heart disease of native coronary artery without angina pectoris: Secondary | ICD-10-CM

## 2022-05-21 DIAGNOSIS — E782 Mixed hyperlipidemia: Secondary | ICD-10-CM

## 2022-05-21 NOTE — Patient Instructions (Signed)
Medication Instructions:  Your physician recommends that you continue on your current medications as directed. Please refer to the Current Medication list given to you today. *If you need a refill on your cardiac medications before your next appointment, please call your pharmacy*   Lab Work: None Ordered   Testing/Procedures: None Ordered   Follow-Up: At [facility], you and your health needs are our priority.  As part of our continuing mission to provide you with exceptional heart care, we have created designated Provider Care Teams.  These Care Teams include your primary Cardiologist (physician) and Advanced Practice Providers (APPs -  Physician Assistants and Nurse Practitioners) who all work together to provide you with the care you need, when you need it.  We recommend signing up for the patient portal called "MyChart".  Sign up information is provided on this After Visit Summary.  MyChart is used to connect with patients for Virtual Visits (Telemedicine).  Patients are able to view lab/test results, encounter notes, upcoming appointments, etc.  Non-urgent messages can be sent to your provider as well.   To learn more about what you can do with MyChart, go to [email or web address].    Your next appointment:   1 year(s)  The format for your next appointment:   In Person  Provider:   Jenkins Rouge, MD     Other Instructions   Important Information About Sugar

## 2022-05-21 NOTE — Progress Notes (Signed)
Cardiology Office Note:    Date:  05/21/2022   ID:  Sean Rosario, DOB [birth year], MRN [id number]  PCP:  Kathalene Frames, MD  [facility] Cardiologist:  Jenkins Rouge, MD  [place] Electrophysiologist:  None   Chief Complaint: yearly follow up   History of Present Illness:    Sean Rosario is a 76 y.o. male with a hx of pericarditis remotely, CAD by coronary CT, HLD, aad myasthenia gravis seen for follow up.   Echo 06/2020: LVEF 60-65%, grade 1 DD, mild to moderate TR Coronary CT 10/2021: extensive  3 V plaque. No significant stenosis. Calcium score of 524. Multi spec nuc 10/2021: Normal stress myoview with no evidence for ischemia or infarction.  Good exercise tolerance.   Here today for follow-up.  He goes to exercise club about 2 times per week.  He does stretching, weight lifting and cardio for 1 hour without any limitation.  He denies chest pain, shortness of breath, dizziness, palpitation, orthopnea, PND, syncope, lower extremity edema or melena.  Compliant with medications.  Past Medical History:  Diagnosis Date   Actinic keratoses    Age related osteoporosis    Atherosclerotic heart disease of native coronary artery with angina pectoris (HCC)    BPH (benign prostatic hyperplasia)    CAD (coronary artery disease)    Colon polyp, hyperplastic    History of anemia    Hypercholesterolemia    Hyperlipidemia    Patient previously elected lifestyle changes in lieu of meds   Impaired fasting glucose    Myasthenia gravis    Only manifested by intermittent double vision. Maintained on mestinon   Ocular myasthenia ([place])    Osteoporosis    Pericarditis    20 years ago    Past Surgical History:  Procedure Laterality Date   INGUINAL HERNIA REPAIR Right    TONSILLECTOMY      Current Medications: Current Meds  Medication Sig   aspirin 81 MG tablet Take 81 mg by mouth daily.   atorvastatin (LIPITOR) 20 MG tablet Take 20 mg by mouth daily.   denosumab (PROLIA)  60 MG/ML SOSY injection Inject 60 mg into the skin every 6 (six) months.   nitroGLYCERIN (NITROSTAT) 0.4 MG SL tablet Place 1 tablet (0.4 mg total) under the tongue every 5 (five) minutes as needed for chest pain.   Omega-3 Fatty Acids (FISH OIL) 1200 MG CAPS Take 1 capsule by mouth daily.   Probiotic Product (PROBIOTIC DAILY PO) Take 1 tablet by mouth daily.    pyridostigmine (MESTINON) 60 MG tablet Take 1 tablet at 8am and 4pm daily.  OK to take an extra dose as needed   tamsulosin (FLOMAX) 0.4 MG CAPS capsule Take 0.4 mg by mouth daily. Take 0.4 mg by mouth daily.   [DISCONTINUED] cholecalciferol (VITAMIN D3) 25 MCG (1000 UNIT) tablet Take 1,000 Units by mouth daily.   [DISCONTINUED] methocarbamol (ROBAXIN) 500 MG tablet Take 1 tablet (500 mg total) by mouth 2 (two) times daily.     Allergies:   Patient has no known allergies.   Social History   Socioeconomic History   Marital status: Married    Spouse name: Not on file   Number of children: 2   Years of education: Not on file   Highest education level: Not on file  Occupational History   Not on file  Tobacco Use   Smoking status: Never   Smokeless tobacco: Never  Vaping Use   Vaping Use: Never used  Substance  and Sexual Activity   Alcohol use: Yes    Alcohol/week: 10.0 standard drinks of alcohol    Types: 10 Glasses of wine per week    Comment: 2 glasses of wine per night most, but not all nights   Drug use: No   Sexual activity: Not on file  Other Topics Concern   Not on file  Social History Narrative   He is retired from [employer].   Lives with wife and mother (61).  They have two children.   Right handed   Two story home   Drinks caffeine   Social Determinants of Health   Financial Resource Strain: Not on file  Food Insecurity: Not on file  Transportation Needs: Not on file  Physical Activity: Not on file  Stress: Not on file  Social Connections: Not on file     Family History: The patient's  family history includes ALS in his father; Epilepsy in his sister; Heart defect in his father; Hypertension in his mother and sister.   ROS:   Please see the history of present illness.    All other systems reviewed and are negative.  EKGs/Labs/Other Studies Reviewed:    The following studies were reviewed today: Echo 06/2020 1. Left ventricular ejection fraction, by estimation, is 60 to 65%. The  left ventricle has normal function. The left ventricle has no regional  wall motion abnormalities. Left ventricular diastolic parameters are  consistent with Grade I diastolic  dysfunction (impaired relaxation).   2. Right ventricular systolic function is normal. The right ventricular  size is normal. There is normal pulmonary artery systolic pressure. The  estimated right ventricular systolic pressure is 34.1 mmHg.   3. The mitral valve is normal in structure. Trivial mitral valve  regurgitation. No evidence of mitral stenosis.   4. Tricuspid valve regurgitation is mild to moderate.   5. The aortic valve is tricuspid. Aortic valve regurgitation is not  visualized. No aortic stenosis is present.   6. The inferior vena cava is normal in size with greater than 50%  respiratory variability, suggesting right atrial pressure of 3 mmHg.   Stress test 10/2021 Findings: The patient exercised 12 minutes reaching > 85% MPHR.  There were nonspecific changes on the ECG, no chest pain.   Perfusion images showed no perfusion defect at rest or with stress.   Gated images showed normal wall motion, EF 68%   IMPRESSION:  Normal stress myoview with no evidence for ischemia or infarction.  Good exercise tolerance.   Coronary CT 10/2021 CORONARY ARTERIES:  Left main coronary artery:  Short vessel  of normal caliber.  There  is an ecentric calcified plaque which is nonobstructive.   Left anterior descending artery: There are two proximal eccentric  calcified plaques.  The second plaque demonstrates  between 25 and  50% luminal narrowing.  There is a prominent diagonal branch which  also contains eccentric calcified plaque which is nonobstructive.   Left circumflex:  A large vessel with an early branching obtuse  marginal.  There is a small eccentric plaque within the proximal  obtuse marginal.   Right coronary artery:  The right coronary is diminutive.  There  are several eccentric calcified plaque  Posterior descending artery:  Normal caliber with nonobstructed  calcified plaque  Dominance:  Left   CORONARY CALCIUM:  Total Agatston Score:  524  MESA database percentile:  82   CARDIAC MEASUREMENTS:  Interventricular septum (6 - 12 mm):  LV posterior wall (6 -  12 mm):  LV diameter in diastole (35 - 52 mm):   AORTA AND PULMONARY MEASUREMENTS:  Aortic root (21 - 40 mm):              30 mm  at the annulus              39 mm  at the sinuses of Valsalva              28 mm  at the sinotubular junction  Ascending aorta ( <  40 mm):  31 mm  Descending aorta ( <  40 mm):  There is a 25 mm  Main pulmonary artery:  ( <  30 mm):  24 mm   EXTRACARDIAC FINDINGS: There is soft tissue thickening associated  with a pleural plaque within the posterior right lower lobe. This  is incompletely imaged.  The imaged portion measures 35 x 13 mm  (image #1).  Mild bronchiectasis at the lung bases.   Limited view of the upper abdomen is unremarkable.   View of the skeleton is unremarkable.   IMPRESSION:   1.  Extensive three-vessel eccentric calcified plaque. No  significant stenosis.   2. The patient's total coronary artery calcium score is 524, which  is 82 percentile for patient's matched age and gender.   3.  Left coronary artery dominance.   4.  Nodular pleural thickening associated with a pleural plaque in  the right lower lobe.  This is incompletely imaged.  Recommend non  emergent CT of the thorax for further evaluation and to serve as a  baseline for follow up.   EKG:  EKG  is ordered today.  The ekg ordered today demonstrates sinus bradycardia  Recent Labs: No results found for requested labs within last 365 days.  Recent Lipid Panel    Component Value Date/Time   CHOL 265 (H) 11/12/2011 0700   TRIG 90 11/12/2011 0700   HDL 92 11/12/2011 0700   CHOLHDL 2.9 11/12/2011 0700   VLDL 18 11/12/2011 0700   LDLCALC 155 (H) 11/12/2011 0700   Physical Exam:    VS:  BP 130/80   Pulse (!) 49   Ht '5\' 5"'$  (1.651 m)   Wt 133 lb 6.4 oz (60.5 kg)   BMI 22.20 kg/m     Wt Readings from Last 3 Encounters:  05/21/22 133 lb 6.4 oz (60.5 kg)  12/24/21 132 lb (59.9 kg)  07/10/21 132 lb (59.9 kg)     GEN:  Well nourished, well developed in no acute distress HEENT: Normal NECK: No JVD; No carotid bruits LYMPHATICS: No lymphadenopathy CARDIAC: RRR, no murmurs, rubs, gallops RESPIRATORY:  Clear to auscultation without rales, wheezing or rhonchi  ABDOMEN: Soft, non-tender, non-distended MUSCULOSKELETAL:  No edema; No deformity  SKIN: Warm and dry NEUROLOGIC:  Alert and oriented x 3 PSYCHIATRIC:  Normal affect   ASSESSMENT AND PLAN:    CAD No anginal symptoms.  Continue aspirin And statin.    2. Hyperlipidemia -Continue statin.  LDL 82.  Medication Adjustments/Labs and Tests Ordered: Current medicines are reviewed at length with the patient today.  Concerns regarding medicines are outlined above.  Orders Placed This Encounter  Procedures   EKG 12-Lead   No orders of the defined types were placed in this encounter.   Patient Instructions  Medication Instructions:  Your physician recommends that you continue on your current medications as directed. Please refer to the Current Medication list given to you today. *If you need a refill  on your cardiac medications before your next appointment, please call your pharmacy*   Lab Work: None Ordered   Testing/Procedures: None Ordered   Follow-Up: At [facility], you and your health needs are  our priority.  As part of our continuing mission to provide you with exceptional heart care, we have created designated Provider Care Teams.  These Care Teams include your primary Cardiologist (physician) and Advanced Practice Providers (APPs -  Physician Assistants and Nurse Practitioners) who all work together to provide you with the care you need, when you need it.  We recommend signing up for the patient portal called "MyChart".  Sign up information is provided on this After Visit Summary.  MyChart is used to connect with patients for Virtual Visits (Telemedicine).  Patients are able to view lab/test results, encounter notes, upcoming appointments, etc.  Non-urgent messages can be sent to your provider as well.   To learn more about what you can do with MyChart, go to [email or web address].    Your next appointment:   1 year(s)  The format for your next appointment:   In Person  Provider:   Jenkins Rouge, MD     Other Instructions   Important Information About Sugar         Signed, Leanor Kail, [place]  05/21/2022 2:45 PM    [facility]

## 2022-05-26 DIAGNOSIS — L821 Other seborrheic keratosis: Secondary | ICD-10-CM | POA: Diagnosis not present

## 2022-05-26 DIAGNOSIS — L570 Actinic keratosis: Secondary | ICD-10-CM | POA: Diagnosis not present

## 2022-05-26 DIAGNOSIS — Z85828 Personal history of other malignant neoplasm of skin: Secondary | ICD-10-CM | POA: Diagnosis not present

## 2022-05-26 DIAGNOSIS — Z08 Encounter for follow-up examination after completed treatment for malignant neoplasm: Secondary | ICD-10-CM | POA: Diagnosis not present

## 2022-05-26 DIAGNOSIS — X32XXXD Exposure to sunlight, subsequent encounter: Secondary | ICD-10-CM | POA: Diagnosis not present

## 2022-05-26 DIAGNOSIS — L820 Inflamed seborrheic keratosis: Secondary | ICD-10-CM | POA: Diagnosis not present

## 2022-07-13 ENCOUNTER — Encounter: Payer: Self-pay | Admitting: Neurology

## 2022-07-25 DIAGNOSIS — M542 Cervicalgia: Secondary | ICD-10-CM | POA: Diagnosis not present

## 2022-08-21 DIAGNOSIS — M810 Age-related osteoporosis without current pathological fracture: Secondary | ICD-10-CM | POA: Diagnosis not present

## 2022-09-30 DIAGNOSIS — Z862 Personal history of diseases of the blood and blood-forming organs and certain disorders involving the immune mechanism: Secondary | ICD-10-CM | POA: Diagnosis not present

## 2022-09-30 DIAGNOSIS — M810 Age-related osteoporosis without current pathological fracture: Secondary | ICD-10-CM | POA: Diagnosis not present

## 2022-09-30 DIAGNOSIS — R7303 Prediabetes: Secondary | ICD-10-CM | POA: Diagnosis not present

## 2022-09-30 DIAGNOSIS — N400 Enlarged prostate without lower urinary tract symptoms: Secondary | ICD-10-CM | POA: Diagnosis not present

## 2022-09-30 DIAGNOSIS — I2510 Atherosclerotic heart disease of native coronary artery without angina pectoris: Secondary | ICD-10-CM | POA: Diagnosis not present

## 2022-09-30 DIAGNOSIS — Z87898 Personal history of other specified conditions: Secondary | ICD-10-CM | POA: Diagnosis not present

## 2022-09-30 DIAGNOSIS — Z5181 Encounter for therapeutic drug level monitoring: Secondary | ICD-10-CM | POA: Diagnosis not present

## 2022-10-01 ENCOUNTER — Ambulatory Visit
Admission: RE | Admit: 2022-10-01 | Discharge: 2022-10-01 | Disposition: A | Discharge status: Home / Self Care | Payer: PPO | Source: Ambulatory Visit | Attending: Internal Medicine | Admitting: Internal Medicine

## 2022-10-01 ENCOUNTER — Other Ambulatory Visit: Payer: Self-pay | Admitting: Internal Medicine

## 2022-10-01 DIAGNOSIS — M810 Age-related osteoporosis without current pathological fracture: Secondary | ICD-10-CM | POA: Diagnosis not present

## 2022-10-01 DIAGNOSIS — M542 Cervicalgia: Secondary | ICD-10-CM

## 2022-10-01 DIAGNOSIS — E7800 Pure hypercholesterolemia, unspecified: Secondary | ICD-10-CM | POA: Diagnosis not present

## 2022-10-01 DIAGNOSIS — I2510 Atherosclerotic heart disease of native coronary artery without angina pectoris: Secondary | ICD-10-CM | POA: Diagnosis not present

## 2022-10-01 DIAGNOSIS — R7303 Prediabetes: Secondary | ICD-10-CM | POA: Diagnosis not present

## 2022-10-01 DIAGNOSIS — E559 Vitamin D deficiency, unspecified: Secondary | ICD-10-CM | POA: Diagnosis not present

## 2022-10-01 DIAGNOSIS — G7000 Myasthenia gravis without (acute) exacerbation: Secondary | ICD-10-CM | POA: Diagnosis not present

## 2022-10-01 DIAGNOSIS — Z87898 Personal history of other specified conditions: Secondary | ICD-10-CM | POA: Diagnosis not present

## 2022-10-01 DIAGNOSIS — Z0000 Encounter for general adult medical examination without abnormal findings: Secondary | ICD-10-CM | POA: Diagnosis not present

## 2022-10-01 DIAGNOSIS — N400 Enlarged prostate without lower urinary tract symptoms: Secondary | ICD-10-CM | POA: Diagnosis not present

## 2022-10-01 DIAGNOSIS — Z1331 Encounter for screening for depression: Secondary | ICD-10-CM | POA: Diagnosis not present

## 2022-10-07 DIAGNOSIS — H43813 Vitreous degeneration, bilateral: Secondary | ICD-10-CM | POA: Diagnosis not present

## 2022-10-07 DIAGNOSIS — H5213 Myopia, bilateral: Secondary | ICD-10-CM | POA: Diagnosis not present

## 2022-10-07 DIAGNOSIS — H25013 Cortical age-related cataract, bilateral: Secondary | ICD-10-CM | POA: Diagnosis not present

## 2022-10-07 DIAGNOSIS — H2513 Age-related nuclear cataract, bilateral: Secondary | ICD-10-CM | POA: Diagnosis not present

## 2022-10-07 DIAGNOSIS — H524 Presbyopia: Secondary | ICD-10-CM | POA: Diagnosis not present

## 2022-10-08 DIAGNOSIS — M542 Cervicalgia: Secondary | ICD-10-CM | POA: Diagnosis not present

## 2022-11-19 DIAGNOSIS — M542 Cervicalgia: Secondary | ICD-10-CM | POA: Diagnosis not present

## 2022-11-24 DIAGNOSIS — Z08 Encounter for follow-up examination after completed treatment for malignant neoplasm: Secondary | ICD-10-CM | POA: Diagnosis not present

## 2022-11-24 DIAGNOSIS — Z85828 Personal history of other malignant neoplasm of skin: Secondary | ICD-10-CM | POA: Diagnosis not present

## 2022-11-24 DIAGNOSIS — D225 Melanocytic nevi of trunk: Secondary | ICD-10-CM | POA: Diagnosis not present

## 2022-11-24 DIAGNOSIS — L570 Actinic keratosis: Secondary | ICD-10-CM | POA: Diagnosis not present

## 2022-12-23 ENCOUNTER — Ambulatory Visit: Payer: PPO | Admitting: Neurology

## 2022-12-23 ENCOUNTER — Encounter: Payer: Self-pay | Admitting: Neurology

## 2022-12-23 VITALS — BP 106/63 | HR 54 | Ht 65.0 in | Wt 130.0 lb

## 2022-12-23 DIAGNOSIS — G7000 Myasthenia gravis without (acute) exacerbation: Secondary | ICD-10-CM | POA: Diagnosis not present

## 2022-12-23 MED ORDER — PYRIDOSTIGMINE BROMIDE 60 MG PO TABS: ORAL_TABLET | ORAL | 3 refills | Status: DC

## 2022-12-23 NOTE — Patient Instructions (Signed)
It was great to see you today!  I will see you back in 1 year 

## 2022-12-23 NOTE — Progress Notes (Signed)
Follow-up Visit   Date: 12/23/22    Sean Rosario MRN: [id number] DOB: Oct 12, 1945   Interim History: Sean Rosario is a 77 y.o. right-handed Caucasian male with history of ocular myasthenia (diagnosed in 2010, on mestinon 60mg  daily), osteoporesis, and hyperlipidemia returning for follow-up of ocular myasthenia gravis.  History of present illness: Symptoms started in 2009 with diplopia and was seen by Dr. Alphonzo Dublin at [facility] who performed single fiber EMG that was mildly abnormal. He does not recall that his antibodies were positive. He was diagnosed with ocular myasthenia in 2010 and was started on mestinon with no benefit.  Prednisone was slowly titrated to 20mg  for one year and diplopia resolved. He was doing well until 2013, when his diplopia returned. Due to osteoporosis, prednisone was not started, but a trial of IVIG was given without benefit. He was started on mestinon 60mg  daily which resolved symptoms.   He has never been hospitalized for myasthenia or showed signs of dysarthria, dysphagia, or limb weakness.  He was previously seeing Dr. Hollice Espy at [facility], but due to distance transitioned care here in January 2015.  He has been doing well on mestinon 60mg  daily and has not had any exacerbations.    UPDATE 12/23/2022:  He is here for 1 year visit.  There has been no significant change in double vision over the past year.  He continues to have symptoms in the evening and takes an extra dose of mestinon about once per week.  He normally takes mestinon 60mg  in the morning only.  When he is at the [facility], he has resorted to wearing an eye patch, because double vision is always worse with distance vision.  No droopiness of the eyelids, difficulty with speech/swallow, or limb weakness.  No new complaints.  Medications:  Current Outpatient Medications on File Prior to Visit  Medication Sig Dispense Refill   aspirin 81 MG tablet Take 81 mg by mouth daily.      atorvastatin (LIPITOR) 20 MG tablet Take 20 mg by mouth daily.     denosumab (PROLIA) 60 MG/ML SOSY injection Inject 60 mg into the skin every 6 (six) months.     nitroGLYCERIN (NITROSTAT) 0.4 MG SL tablet Place 1 tablet (0.4 mg total) under the tongue every 5 (five) minutes as needed for chest pain. 25 tablet 11   Omega-3 Fatty Acids (FISH OIL) 1200 MG CAPS Take 1 capsule by mouth daily.     Probiotic Product (PROBIOTIC DAILY PO) Take 1 tablet by mouth daily.      pyridostigmine (MESTINON) 60 MG tablet Take 1 tablet at 8am and 4pm daily.  OK to take an extra dose as needed (Patient taking differently: Take one tablet once a day and another as needed) 200 tablet 3   tamsulosin (FLOMAX) 0.4 MG CAPS capsule Take 0.4 mg by mouth daily. Take 0.4 mg by mouth daily.     No current facility-administered medications on file prior to visit.    Allergies: No Known Allergies   Vital Signs:  BP 106/63   Pulse (!) 54   Ht 5\' 5"  (1.651 m)   Wt 130 lb (59 kg)   SpO2 96%   BMI 21.63 kg/m    Neurological Exam: MENTAL STATUS including orientation to time, place, person, recent and remote memory, attention span and concentration, language, and fund of knowledge is normal.  Speech is not dysarthric.  CRANIAL NERVES:  Normal conjugate, extra-ocular eye movements in all directions of gaze.  Subtle left ptosis no worsening with sustained upgaze (old, stable).  Face is symmetric with normal motor strength testing of facial muscles.  Tongue is midline and strength is 5/5.  MOTOR:  Motor strength is 5/5 in all extremities, no fatigability.  Tone is normal.   COORDINATION/GAIT:  Gait narrow based and stable.   Data: Labs received from [facility] dated 09/25/2011: [place] antibody titer negative, acetylcholine binding antibody (<0.03) - negative  CT chest 10/20/2014: no evidence of thymoma  CT head 05/09/2008: No acute intracranial abnormality.   US carotids 05/09/2008: No significant carotid stenosis  identified by duplex ultrasound. There is a mild amount of plaque present bilaterally. Estimated ICA stenoses are less than 50% bilaterally. Incidental cystic nodules in both lobes of the thyroid gland which appears small and are likely benign.   IMPRESSION: Ocular seronegative myasthenia gravis (thymoma negative, diagnosed 2020 by [facility] at [facility]).  Clinically, he has mild diplopia which is worse in the evening and more noticeable with distance vision.  Previously, he has been on prednisone 20mg  (stopped due to osteoporosis) and IVIG (no benefit).  Due to pure ocular symptoms, we have been managing with mestinon alone.  He will continue mestinon 60mg  daily and may take an extra tablet as needed.  Return to clinic in 1 year  Thank you for allowing me to participate in patient's care.  If I can answer any additional questions, I would be pleased to do so.    Sincerely,    Marabeth Melland K. Allena Katz, DO

## 2022-12-28 ENCOUNTER — Ambulatory Visit: Payer: PPO | Admitting: Neurology

## 2023-02-26 DIAGNOSIS — M810 Age-related osteoporosis without current pathological fracture: Secondary | ICD-10-CM | POA: Diagnosis not present

## 2023-03-22 DIAGNOSIS — H524 Presbyopia: Secondary | ICD-10-CM | POA: Diagnosis not present

## 2023-03-22 DIAGNOSIS — H52203 Unspecified astigmatism, bilateral: Secondary | ICD-10-CM | POA: Diagnosis not present

## 2023-03-22 DIAGNOSIS — H25013 Cortical age-related cataract, bilateral: Secondary | ICD-10-CM | POA: Diagnosis not present

## 2023-03-22 DIAGNOSIS — H2513 Age-related nuclear cataract, bilateral: Secondary | ICD-10-CM | POA: Diagnosis not present

## 2023-03-22 DIAGNOSIS — H43813 Vitreous degeneration, bilateral: Secondary | ICD-10-CM | POA: Diagnosis not present

## 2023-03-22 DIAGNOSIS — H5213 Myopia, bilateral: Secondary | ICD-10-CM | POA: Diagnosis not present

## 2023-04-15 DIAGNOSIS — H25012 Cortical age-related cataract, left eye: Secondary | ICD-10-CM | POA: Diagnosis not present

## 2023-04-15 DIAGNOSIS — H25812 Combined forms of age-related cataract, left eye: Secondary | ICD-10-CM | POA: Diagnosis not present

## 2023-04-15 DIAGNOSIS — H2512 Age-related nuclear cataract, left eye: Secondary | ICD-10-CM | POA: Diagnosis not present

## 2023-05-06 DIAGNOSIS — H2511 Age-related nuclear cataract, right eye: Secondary | ICD-10-CM | POA: Diagnosis not present

## 2023-05-06 DIAGNOSIS — H25811 Combined forms of age-related cataract, right eye: Secondary | ICD-10-CM | POA: Diagnosis not present

## 2023-05-06 DIAGNOSIS — H25011 Cortical age-related cataract, right eye: Secondary | ICD-10-CM | POA: Diagnosis not present

## 2023-06-24 NOTE — Progress Notes (Signed)
Cardiology Office Note:    Date:  07/06/2023   ID:  KLEBER DIERSEN, DOB 1946-08-20, MRN [id number]  PCP:  Emilio Aspen, MD  [facility] Cardiologist:  Charlton Haws, MD  [facility] Electrophysiologist:  None   Chief Complaint: yearly follow up   History of Present Illness:    Sean Rosario is a 77 y.o. male with a hx of pericarditis remotely, CAD by coronary CT, HLD, ocular myesthenia,  seen for follow up.   Echo 06/2020: LVEF 60-65%, grade 1 DD, mild to moderate TR Coronary CT 10/2021: extensive  3 V plaque. No significant stenosis. Calcium score of 524. Multi spec nuc 10/2021: Normal stress myoview with no evidence for ischemia or infarction.  Good exercise tolerance.   Here today for follow-up.  He goes to exercise club about 2 times per week.  He does stretching, weight lifting and cardio for 1 hour without any limitation.  He denies chest pain, shortness of breath, dizziness, palpitation, orthopnea, PND, syncope, lower extremity edema or melena.  Compliant with medications.  Wife has macular and cannot drive His vision is poor latter in day 3 kids two at [place] one in [place] and one in [place]  Past Medical History:  Diagnosis Date   Actinic keratoses    Age related osteoporosis    Atherosclerotic heart disease of native coronary artery with angina pectoris (HCC)    BPH (benign prostatic hyperplasia)    CAD (coronary artery disease)    Colon polyp, hyperplastic    History of anemia    Hypercholesterolemia    Hyperlipidemia    Patient previously elected lifestyle changes in lieu of meds   Impaired fasting glucose    Myasthenia gravis    Only manifested by intermittent double vision. Maintained on mestinon   Ocular myasthenia (HCC)    Osteoporosis    Pericarditis    20 years ago    Past Surgical History:  Procedure Laterality Date   INGUINAL HERNIA REPAIR Right    TONSILLECTOMY      Current Medications: Current Meds  Medication Sig   aspirin  81 MG tablet Take 81 mg by mouth daily.   atorvastatin (LIPITOR) 20 MG tablet Take 20 mg by mouth daily.   denosumab (PROLIA) 60 MG/ML SOSY injection Inject 60 mg into the skin every 6 (six) months.   nitroGLYCERIN (NITROSTAT) 0.4 MG SL tablet Place 1 tablet (0.4 mg total) under the tongue every 5 (five) minutes as needed for chest pain.   Omega-3 Fatty Acids (FISH OIL) 1200 MG CAPS Take 1 capsule by mouth daily.   Probiotic Product (PROBIOTIC DAILY PO) Take 1 tablet by mouth daily.    pyridostigmine (MESTINON) 60 MG tablet Take one tablet around 8am daily.  OK to take an extra tablet in the evening as needed.   tamsulosin (FLOMAX) 0.4 MG CAPS capsule Take 0.4 mg by mouth daily. Take 0.4 mg by mouth daily.     Allergies:   Patient has no known allergies.   Social History   Socioeconomic History   Marital status: Married    Spouse name: Not on file   Number of children: 2   Years of education: Not on file   Highest education level: Not on file  Occupational History   Not on file  Tobacco Use   Smoking status: Never   Smokeless tobacco: Never  Vaping Use   Vaping status: Never Used  Substance and Sexual Activity   Alcohol use: Yes  Alcohol/week: 10.0 standard drinks of alcohol    Types: 10 Glasses of wine per week    Comment: 2 glasses of wine per night most, but not all nights   Drug use: No   Sexual activity: Not on file  Other Topics Concern   Not on file  Social History Narrative   He is retired from [employer], site.   Lives with wife and mother (75).  They have two children.   Right handed   Two story home   Drinks caffeine   Social Determinants of Health   Financial Resource Strain: Not on file  Food Insecurity: Not on file  Transportation Needs: Not on file  Physical Activity: Not on file  Stress: Not on file  Social Connections: Not on file     Family History: The patient's family history includes ALS in his father; Epilepsy in his sister; Heart  defect in his father; Hypertension in his mother and sister.   ROS:   Please see the history of present illness.    All other systems reviewed and are negative.  EKGs/Labs/Other Studies Reviewed:    The following studies were reviewed today: Echo 06/2020 1. Left ventricular ejection fraction, by estimation, is 60 to 65%. The  left ventricle has normal function. The left ventricle has no regional  wall motion abnormalities. Left ventricular diastolic parameters are  consistent with Grade I diastolic  dysfunction (impaired relaxation).   2. Right ventricular systolic function is normal. The right ventricular  size is normal. There is normal pulmonary artery systolic pressure. The  estimated right ventricular systolic pressure is 29.0 mmHg.   3. The mitral valve is normal in structure. Trivial mitral valve  regurgitation. No evidence of mitral stenosis.   4. Tricuspid valve regurgitation is mild to moderate.   5. The aortic valve is tricuspid. Aortic valve regurgitation is not  visualized. No aortic stenosis is present.   6. The inferior vena cava is normal in size with greater than 50%  respiratory variability, suggesting right atrial pressure of 3 mmHg.   Stress test 10/2021 Findings: The patient exercised 12 minutes reaching > 85% MPHR.  There were nonspecific changes on the ECG, no chest pain.   Perfusion images showed no perfusion defect at rest or with stress.   Gated images showed normal wall motion, EF 68%   IMPRESSION:  Normal stress myoview with no evidence for ischemia or infarction.  Good exercise tolerance.   Coronary CT 10/2021 CORONARY ARTERIES:  Left main coronary artery:  Short vessel  of normal caliber.  There  is an ecentric calcified plaque which is nonobstructive.   Left anterior descending artery: There are two proximal eccentric  calcified plaques.  The second plaque demonstrates between 25 and  50% luminal narrowing.  There is a prominent diagonal branch  which  also contains eccentric calcified plaque which is nonobstructive.   Left circumflex:  A large vessel with an early branching obtuse  marginal.  There is a small eccentric plaque within the proximal  obtuse marginal.   Right coronary artery:  The right coronary is diminutive.  There  are several eccentric calcified plaque  Posterior descending artery:  Normal caliber with nonobstructed  calcified plaque  Dominance:  Left   CORONARY CALCIUM:  Total Agatston Score:  524  MESA database percentile:  82   CARDIAC MEASUREMENTS:  Interventricular septum (6 - 12 mm):  LV posterior wall (6 - 12 mm):  LV diameter in diastole (35 - 52  mm):   AORTA AND PULMONARY MEASUREMENTS:  Aortic root (21 - 40 mm):              30 mm  at the annulus              39 mm  at the sinuses of Valsalva              28 mm  at the sinotubular junction  Ascending aorta ( <  40 mm):  31 mm  Descending aorta ( <  40 mm):  There is a 25 mm  Main pulmonary artery:  ( <  30 mm):  24 mm   EXTRACARDIAC FINDINGS: There is soft tissue thickening associated  with a pleural plaque within the posterior right lower lobe. This  is incompletely imaged.  The imaged portion measures 35 x 13 mm  (image #1).  Mild bronchiectasis at the lung bases.   Limited view of the upper abdomen is unremarkable.   View of the skeleton is unremarkable.   IMPRESSION:   1.  Extensive three-vessel eccentric calcified plaque. No  significant stenosis.   2. The patient's total coronary artery calcium score is 524, which  is 82 percentile for patient's matched age and gender.   3.  Left coronary artery dominance.   4.  Nodular pleural thickening associated with a pleural plaque in  the right lower lobe.  This is incompletely imaged.  Recommend non  emergent CT of the thorax for further evaluation and to serve as a  baseline for follow up.   EKG:  EKG is ordered today.  The ekg ordered today demonstrates sinus  bradycardia  Recent Labs: No results found for requested labs within last 365 days.  Recent Lipid Panel    Component Value Date/Time   CHOL 265 (H) 11/12/2011 0700   TRIG 90 11/12/2011 0700   HDL 92 11/12/2011 0700   CHOLHDL 2.9 11/12/2011 0700   VLDL 18 11/12/2011 0700   LDLCALC 155 (H) 11/12/2011 0700   Physical Exam:    VS:  BP 106/78   Pulse (!) 55   Ht 5\' 5"  (1.651 m)   Wt 133 lb (60.3 kg)   SpO2 97%   BMI 22.13 kg/m     Wt Readings from Last 3 Encounters:  07/06/23 133 lb (60.3 kg)  12/23/22 130 lb (59 kg)  05/21/22 133 lb 6.4 oz (60.5 kg)     GEN:  Well nourished, well developed in no acute distress HEENT: Normal NECK: No JVD; No carotid bruits LYMPHATICS: No lymphadenopathy CARDIAC: RRR, no murmurs, rubs, gallops RESPIRATORY:  Clear to auscultation without rales, wheezing or rhonchi  ABDOMEN: Soft, non-tender, non-distended MUSCULOSKELETAL:  No edema; No deformity  SKIN: Warm and dry NEUROLOGIC:  Alert and oriented x 3 PSYCHIATRIC:  Normal affect   ASSESSMENT AND PLAN:    CAD No anginal symptoms.  Continue aspirin And statin.  Normal myovue 06/28/20   2. Hyperlipidemia -Continue statin.  Labs with primary target LDL <70  3. Ocular Myasthenia on mestinon f/u neurology :    Medication Adjustments/Labs and Tests Ordered: Current medicines are reviewed at length with the patient today.  Concerns regarding medicines are outlined above.  Orders Placed This Encounter  Procedures   EKG 12-Lead   No orders of the defined types were placed in this encounter.   There are no Patient Instructions on file for this visit.   Signed, Charlton Haws, MD  07/06/2023 8:53 AM    [facility]

## 2023-07-06 ENCOUNTER — Encounter: Payer: Self-pay | Admitting: Cardiovascular Disease

## 2023-07-06 ENCOUNTER — Ambulatory Visit: Payer: PPO | Attending: Cardiovascular Disease | Admitting: Cardiovascular Disease

## 2023-07-06 VITALS — BP 106/78 | HR 55 | Ht 65.0 in | Wt 133.0 lb

## 2023-07-06 DIAGNOSIS — I2510 Atherosclerotic heart disease of native coronary artery without angina pectoris: Secondary | ICD-10-CM | POA: Diagnosis not present

## 2023-07-06 DIAGNOSIS — E782 Mixed hyperlipidemia: Secondary | ICD-10-CM | POA: Diagnosis not present

## 2023-07-06 NOTE — Patient Instructions (Signed)
Medication Instructions:  Your physician recommends that you continue on your current medications as directed. Please refer to the Current Medication list given to you today.  *If you need a refill on your cardiac medications before your next appointment, please call your pharmacy*  Lab Work: If you have labs (blood work) drawn today and your tests are completely normal, you will receive your results only by: MyChart Message (if you have MyChart) OR A paper copy in the mail If you have any lab test that is abnormal or we need to change your treatment, we will call you to review the results.  Follow-Up: At [facility], you and your health needs are our priority.  As part of our continuing mission to provide you with exceptional heart care, we have created designated Provider Care Teams.  These Care Teams include your primary Cardiologist (physician) and Advanced Practice Providers (APPs -  Physician Assistants and Nurse Practitioners) who all work together to provide you with the care you need, when you need it.  We recommend signing up for the patient portal called "MyChart".  Sign up information is provided on this After Visit Summary.  MyChart is used to connect with patients for Virtual Visits (Telemedicine).  Patients are able to view lab/test results, encounter notes, upcoming appointments, etc.  Non-urgent messages can be sent to your provider as well.   To learn more about what you can do with MyChart, go to [email or web address].    Your next appointment:   1 year(s)  Provider:   Peter Nishan, MD     

## 2023-08-30 DIAGNOSIS — M810 Age-related osteoporosis without current pathological fracture: Secondary | ICD-10-CM | POA: Diagnosis not present

## 2023-10-06 ENCOUNTER — Other Ambulatory Visit: Payer: Self-pay | Admitting: Internal Medicine

## 2023-10-06 DIAGNOSIS — M810 Age-related osteoporosis without current pathological fracture: Secondary | ICD-10-CM

## 2023-12-20 ENCOUNTER — Ambulatory Visit: Payer: PPO | Admitting: Neurology

## 2023-12-20 ENCOUNTER — Encounter: Payer: Self-pay | Admitting: Neurology

## 2023-12-20 VITALS — BP 120/73 | HR 50 | Ht 65.0 in | Wt 132.0 lb

## 2023-12-20 DIAGNOSIS — G7000 Myasthenia gravis without (acute) exacerbation: Secondary | ICD-10-CM

## 2023-12-20 MED ORDER — PYRIDOSTIGMINE BROMIDE 60 MG PO TABS: ORAL_TABLET | ORAL | 3 refills | Status: AC

## 2023-12-20 NOTE — Progress Notes (Signed)
 Follow-up Visit   Date: 12/20/23    Sean Rosario MRN: [id number] DOB: 13-Nov-1945   Interim History: Sean Rosario is a 78 y.o. right-handed Caucasian male with history of ocular myasthenia (diagnosed in 2010, on mestinon 60mg  daily), osteoporesis, and hyperlipidemia returning for follow-up of ocular myasthenia gravis.  IMPRESSION/PLAN: Ocular seronegative myasthenia gravis (thymoma negative, diagnosed 2010 by [facility] at [facility]).  In the past, he has been on prednisone 20mg  (stopped due to osteoporosis) and IVIG (no benefit).  Clinically, he continues to have mild diplopia worse in the evening.  He takes mestinon 60mg  daily + extra tablet as needed, which will be continued.   Return to clinic in 1 year  ---------------------------------------------------------------------- History of present illness: Symptoms started in 2009 with diplopia and was seen by Dr. Alphonzo Dublin at [facility] who performed single fiber EMG that was mildly abnormal. He does not recall that his antibodies were positive. He was diagnosed with ocular myasthenia in 2010 and was started on mestinon with no benefit.  Prednisone was slowly titrated to 20mg  for one year and diplopia resolved. He was doing well until 2013, when his diplopia returned. Due to osteoporosis, prednisone was not started, but a trial of IVIG was given without benefit. He was started on mestinon 60mg  daily which resolved symptoms.   He has never been hospitalized for myasthenia or showed signs of dysarthria, dysphagia, or limb weakness.  He was previously seeing Dr. Hollice Espy at [facility], but due to distance transitioned care here in January 2015.  He has been doing well on mestinon 60mg  daily and has not had any exacerbations.    UPDATE 12/20/2023:  He is here for 1 year follow-up.  He continues to have double vision daily which is present in the evening.  He takes mestinon 60mg  daily and occasionally takes an extra tablet.  Overall,  symptoms are stable.  No droopiness of the eyelids, difficulty with speech/swallow, or limb weakness.  No new complaints.  Medications:  Current Outpatient Medications on File Prior to Visit  Medication Sig Dispense Refill   aspirin 81 MG tablet Take 81 mg by mouth daily.     atorvastatin (LIPITOR) 20 MG tablet Take 20 mg by mouth daily.     denosumab (PROLIA) 60 MG/ML SOSY injection Inject 60 mg into the skin every 6 (six) months.     nitroGLYCERIN (NITROSTAT) 0.4 MG SL tablet Place 1 tablet (0.4 mg total) under the tongue every 5 (five) minutes as needed for chest pain. 25 tablet 11   Omega-3 Fatty Acids (FISH OIL) 1200 MG CAPS Take 1 capsule by mouth daily.     Probiotic Product (PROBIOTIC DAILY PO) Take 1 tablet by mouth daily.      pyridostigmine (MESTINON) 60 MG tablet Take one tablet around 8am daily.  OK to take an extra tablet in the evening as needed. 200 tablet 3   tamsulosin (FLOMAX) 0.4 MG CAPS capsule Take 0.4 mg by mouth daily. Take 0.4 mg by mouth daily.     No current facility-administered medications on file prior to visit.    Allergies: No Known Allergies   Vital Signs:  BP 120/73   Pulse (!) 50   Ht 5\' 5"  (1.651 m)   Wt 132 lb (59.9 kg)   SpO2 99%   BMI 21.97 kg/m    Neurological Exam: MENTAL STATUS including orientation to time, place, person, recent and remote memory, attention span and concentration, language, and fund of knowledge is normal.  Speech is not dysarthric.  CRANIAL NERVES:  Normal conjugate, extra-ocular eye movements in all directions of gaze.  Subtle left ptosis no worsening with sustained upgaze (old, stable).  Face is symmetric with normal motor strength testing of facial muscles.    MOTOR:  Motor strength is 5/5 in all extremities, no fatigability.  Tone is normal.   COORDINATION/GAIT:  Gait narrow based and stable.   Data: Labs received from [facility] dated 09/25/2011: Musk antibody titer negative, acetylcholine binding  antibody (<0.03) - negative  CT chest 10/20/2014: no evidence of thymoma  CT head 05/09/2008: No acute intracranial abnormality.   US carotids 05/09/2008: No significant carotid stenosis identified by duplex ultrasound. There is a mild amount of plaque present bilaterally. Estimated ICA stenoses are less than 50% bilaterally. Incidental cystic nodules in both lobes of the thyroid gland which appears small and are likely benign.     Thank you for allowing me to participate in patient's care.  If I can answer any additional questions, I would be pleased to do so.    Sincerely,    Marrie Chandra K. Allena Katz, DO

## 2024-02-09 DIAGNOSIS — I2510 Atherosclerotic heart disease of native coronary artery without angina pectoris: Secondary | ICD-10-CM | POA: Diagnosis not present

## 2024-02-12 DIAGNOSIS — E7800 Pure hypercholesterolemia, unspecified: Secondary | ICD-10-CM | POA: Diagnosis not present

## 2024-02-12 DIAGNOSIS — I2510 Atherosclerotic heart disease of native coronary artery without angina pectoris: Secondary | ICD-10-CM | POA: Diagnosis not present

## 2024-02-12 DIAGNOSIS — M810 Age-related osteoporosis without current pathological fracture: Secondary | ICD-10-CM | POA: Diagnosis not present

## 2024-02-12 DIAGNOSIS — N400 Enlarged prostate without lower urinary tract symptoms: Secondary | ICD-10-CM | POA: Diagnosis not present

## 2024-02-29 DIAGNOSIS — M810 Age-related osteoporosis without current pathological fracture: Secondary | ICD-10-CM | POA: Diagnosis not present

## 2024-03-09 DIAGNOSIS — I2510 Atherosclerotic heart disease of native coronary artery without angina pectoris: Secondary | ICD-10-CM | POA: Diagnosis not present

## 2024-03-13 DIAGNOSIS — I2510 Atherosclerotic heart disease of native coronary artery without angina pectoris: Secondary | ICD-10-CM | POA: Diagnosis not present

## 2024-03-13 DIAGNOSIS — M810 Age-related osteoporosis without current pathological fracture: Secondary | ICD-10-CM | POA: Diagnosis not present

## 2024-03-13 DIAGNOSIS — N400 Enlarged prostate without lower urinary tract symptoms: Secondary | ICD-10-CM | POA: Diagnosis not present

## 2024-03-13 DIAGNOSIS — E7800 Pure hypercholesterolemia, unspecified: Secondary | ICD-10-CM | POA: Diagnosis not present

## 2024-03-26 ENCOUNTER — Encounter (HOSPITAL_COMMUNITY): Payer: Self-pay

## 2024-03-26 ENCOUNTER — Ambulatory Visit (INDEPENDENT_AMBULATORY_CARE_PROVIDER_SITE_OTHER)

## 2024-03-26 ENCOUNTER — Ambulatory Visit (HOSPITAL_COMMUNITY)
Admission: EM | Admit: 2024-03-26 | Discharge: 2024-03-26 | Disposition: A | Discharge status: Home / Self Care | Attending: Physician Assistant | Admitting: Physician Assistant

## 2024-03-26 DIAGNOSIS — M7581 Other shoulder lesions, right shoulder: Secondary | ICD-10-CM | POA: Diagnosis not present

## 2024-03-26 DIAGNOSIS — M70811 Other soft tissue disorders related to use, overuse and pressure, right shoulder: Secondary | ICD-10-CM | POA: Diagnosis not present

## 2024-03-26 DIAGNOSIS — M19011 Primary osteoarthritis, right shoulder: Secondary | ICD-10-CM | POA: Diagnosis not present

## 2024-03-26 DIAGNOSIS — M25511 Pain in right shoulder: Secondary | ICD-10-CM | POA: Diagnosis not present

## 2024-03-26 DIAGNOSIS — M85811 Other specified disorders of bone density and structure, right shoulder: Secondary | ICD-10-CM | POA: Diagnosis not present

## 2024-03-26 MED ORDER — PREDNISONE 20 MG PO TABS: 20.0000 mg | ORAL_TABLET | Freq: Every day | ORAL | 0 refills | Status: AC

## 2024-03-26 MED ORDER — DICLOFENAC SODIUM 1 % EX GEL: 2.0000 g | Freq: Three times a day (TID) | CUTANEOUS | 0 refills | Status: AC | PRN

## 2024-03-26 NOTE — ED Provider Notes (Signed)
 [facility]    CSN: [id number] Arrival date & time: 03/26/24  1514      History   Chief Complaint Chief Complaint  Patient presents with   Shoulder Pain    HPI Sean Rosario is a 78 y.o. male.   Patient presents today with a 2-day history of worsening right shoulder pain.  He denies any known injury but does report that he has been more physically active lately and has been lifting heavy objects.  He does not have a specific time that he remembers injuring himself but reports that the pain began yesterday and has gradually worsened.  It is minimal at rest but increases with certain movements including abduction and overhead flexion.  Pain has been as severe as 8 on a certain pain scale, described as aching with periodic sharp pains, no alleviating factors notified.  He denies any previous injury or surgery involving his right shoulder; has had surgery on his left.  He has taken Tylenol  without improvement of symptoms.  He denies any numbness or paresthesias in the hand.  He is right-handed.  He is anxious to feel better as he is scheduled to fly to [facility] tomorrow for his daughter's birthday party.    Past Medical History:  Diagnosis Date   Actinic keratoses    Age related osteoporosis    Atherosclerotic heart disease of native coronary artery with angina pectoris (HCC)    BPH (benign prostatic hyperplasia)    CAD (coronary artery disease)    Colon polyp, hyperplastic    History of anemia    Hypercholesterolemia    Hyperlipidemia    Patient previously elected lifestyle changes in lieu of meds   Impaired fasting glucose    Myasthenia gravis    Only manifested by intermittent double vision. Maintained on mestinon    Ocular myasthenia (HCC)    Osteoporosis    Pericarditis    20 years ago    Patient Active Problem List   Diagnosis Date Noted   Ocular myasthenia gravis (HCC) 10/06/2013   Leg pain 08/21/2013   Lactic acidosis 08/21/2013   Myasthenia gravis (HCC)  08/21/2013   Coronary artery disease, non-occlusive 01/13/2012   Myasthenia gravis (HCC)    Osteoporosis    Hyperlipidemia     Past Surgical History:  Procedure Laterality Date   INGUINAL HERNIA REPAIR Right    TONSILLECTOMY         Home Medications    Prior to Admission medications   Medication Sig Start Date End Date Taking? Authorizing Provider  diclofenac  Sodium (VOLTAREN ) 1 % GEL Apply 2 g topically 3 (three) times daily with meals as needed. 03/26/24  Yes Jennine Peddy K, PA-C  predniSONE  (DELTASONE ) 20 MG tablet Take 1 tablet (20 mg total) by mouth daily for 3 days. 03/26/24 03/29/24 Yes Joleigh Mineau K, PA-C  aspirin  81 MG tablet Take 81 mg by mouth daily.    [provider]  atorvastatin  (LIPITOR ) 20 MG tablet Take 20 mg by mouth daily.    [provider]  denosumab  (PROLIA ) 60 MG/ML SOSY injection Inject 60 mg into the skin every 6 (six) months.    [provider]  nitroGLYCERIN  (NITROSTAT ) 0.4 MG SL tablet Place 1 tablet (0.4 mg total) under the tongue every 5 (five) minutes as needed for chest pain. 01/13/12   Wall, Debby BROCKS, MD  Omega-3 Fatty Acids (FISH OIL) 1200 MG CAPS Take 1 capsule by mouth daily.    [provider]  Probiotic Product (  PROBIOTIC DAILY PO) Take 1 tablet by mouth daily.     [provider]  pyridostigmine  (MESTINON ) 60 MG tablet Take one tablet around 8am daily.  OK to take an extra tablet in the evening as needed. 12/20/23   Patel, Donika K, DO  tamsulosin (FLOMAX) 0.4 MG CAPS capsule Take 0.4 mg by mouth daily. Take 0.4 mg by mouth daily. 03/21/21   [provider]    Family History Family History  Problem Relation Age of Onset   ALS Father        Deceased, 74   Heart defect Father        2 valve surgeries   Hypertension Mother    Hypertension Sister    Epilepsy Sister     Social History Social History   Tobacco Use   Smoking status: Never   Smokeless tobacco: Never  Vaping Use   Vaping  status: Never Used  Substance Use Topics   Alcohol use: Yes    Alcohol/week: 10.0 standard drinks of alcohol    Types: 10 Glasses of wine per week    Comment: 2 glasses of wine per night most, but not all nights   Drug use: No     Allergies   Patient has no known allergies.   Review of Systems Review of Systems  Constitutional:  Positive for activity change. Negative for appetite change, fatigue and fever.  Musculoskeletal:  Positive for arthralgias. Negative for myalgias.  Skin:  Negative for color change and wound.  Neurological:  Negative for weakness and numbness.     Physical Exam Triage Vital Signs ED Triage Vitals [03/26/24 1531]  Encounter Vitals Group     BP (!) 155/80     Girls Systolic BP Percentile      Girls Diastolic BP Percentile      Boys Systolic BP Percentile      Boys Diastolic BP Percentile      Pulse Rate (!) 52     Resp 16     Temp 98.2 F (36.8 C)     Temp Source Oral     SpO2 97 %     Weight      Height      Head Circumference      Peak Flow      Pain Score 7     Pain Loc      Pain Education      Exclude from Growth Chart    No data found.  Updated Vital Signs BP (!) 155/80 (BP Location: Left Arm)   Pulse (!) 52   Temp 98.2 F (36.8 C) (Oral)   Resp 16   SpO2 97%   Visual Acuity Right Eye Distance:   Left Eye Distance:   Bilateral Distance:    Right Eye Near:   Left Eye Near:    Bilateral Near:     Physical Exam Vitals reviewed.  Constitutional:      General: He is awake.     Appearance: Normal appearance. He is well-developed. He is not ill-appearing.     Comments: Very pleasant male appears stated age in no acute distress sitting comfortably in exam room  HENT:     Head: Normocephalic and atraumatic.  Cardiovascular:     Rate and Rhythm: Normal rate and regular rhythm.     Heart sounds: Normal heart sounds, S1 normal and S2 normal. No murmur heard.    Comments: Capillary fill within 2 seconds right  fingers Pulmonary:  Effort: Pulmonary effort is normal.     Breath sounds: Normal breath sounds. No stridor. No wheezing, rhonchi or rales.     Comments: Clear to auscultation bilaterally Musculoskeletal:     Right shoulder: Tenderness present. No swelling, deformity or bony tenderness. Decreased range of motion. Normal strength.     Cervical back: No tenderness or bony tenderness.     Comments: Right shoulder: Mild tenderness palpation over AC joint.  No deformity noted.  Decreased range of motion with overhead flexion and abduction.  Hand is neurovascularly intact.  Negative drop arm and empty can.  Neurological:     Mental Status: He is alert.  Psychiatric:        Behavior: Behavior is cooperative.      UC Treatments / Results  Labs (all labs ordered are listed, but only abnormal results are displayed) Labs Reviewed - No data to display  EKG   Radiology DG Shoulder Right Result Date: 03/26/2024 CLINICAL DATA:  pain and swelling EXAM: RIGHT SHOULDER - 2+ VIEW COMPARISON:  None Available. FINDINGS: Osteopenia. No acute fracture or dislocation. Moderate joint space narrowing and osteophyte formation of the acromioclavicular joint. Mild subcortical cystic change along the site of rotator cuff insertion. No area of erosion or osseous destruction. No unexpected radiopaque foreign body. Soft tissues are unremarkable. IMPRESSION: 1. Moderate degenerative changes of the acromioclavicular joint. 2. Mild subcortical cystic change along the site of rotator cuff insertion. This could reflect underlying rotator cuff pathology. Electronically Signed   By: Corean Salter M.D.   On: 03/26/2024 16:21    Procedures Procedures (including critical care time)  Medications Ordered in UC Medications - No data to display  Initial Impression / Assessment and Plan / UC Course  I have reviewed the triage vital signs and the nursing notes.  Pertinent labs & imaging results that were available  during my care of the patient were reviewed by me and considered in my medical decision making (see chart for details).     Patient is well-appearing, afebrile, nontoxic, nontachycardic.  X-ray was obtained that showed degenerative changes with cystic structure concerning for rotator cuff tendinopathy.  Low suspicion for complete rotator cuff tear given clinical presentation but we did discuss that he likely has inflammation in this region causing his symptoms.  He was given topical Voltaren  to be used up to 3 times a day.  His last metabolic panel was from 2013 and showed no evidence of chronic kidney disease; he denies any history of chronic kidney disease and we discussed that there is limited absorption with topical dosing of this medication but he should use it for as short a time as possible.  Will start short course of low-dose prednisone  20 mg for 3 days we discussed that he is not to take oral NSAIDs with this medication as risk of GI bleeding.  He can continue Tylenol  for pain relief.  Discussed that I do not have any reason he could not travel as long as he avoids strenuous activity that could exacerbate his pain.  I did recommend that if he does not have significant improvement with conservative treatment measures he follows up with orthopedics as soon as he returns to town and he was given the contact information for local provider with instruction to call to schedule appointment.  If he has any worsening or changing symptoms he is to be seen immediately.  His return precautions given.  All questions were answered to patient satisfaction.  Final Clinical Impressions(s) /  UC Diagnoses   Final diagnoses:  Acute pain of right shoulder  Right rotator cuff tendinitis     Discharge Instructions      Your x-ray showed that you have arthritis in your shoulder as well as some changes that might indicate your rotator cuff is inflamed.  Please apply diclofenac  up to 3 times a day to the skin to  help with discomfort.  Avoid strenuous activity including heavy lifting.  Take prednisone  20 mg for 3 days.  Do not take additional NSAIDs with this medication including aspirin , ibuprofen/Advil, naproxen/Aleve.  You can use Tylenol /acetaminophen  as needed for additional pain relief.  If your symptoms are not significantly improved within a few days please follow-up with orthopedics; call to schedule appointment.  If anything worsens and you have increasing pain, numbness or tingling in your hands, fever, decreased range of motion you should be seen immediately.     ED Prescriptions     Medication Sig Dispense Auth. Provider   diclofenac  Sodium (VOLTAREN ) 1 % GEL Apply 2 g topically 3 (three) times daily with meals as needed. 50 g Cambreigh Dearing K, PA-C   predniSONE  (DELTASONE ) 20 MG tablet Take 1 tablet (20 mg total) by mouth daily for 3 days. 3 tablet Keegan Bensch K, PA-C      PDMP not reviewed this encounter.   Sherrell Rocky POUR, PA-C 03/26/24 1639

## 2024-03-26 NOTE — ED Triage Notes (Addendum)
 Patient reports that he has been lifting weights and lifting objects off of his patio. Patient not sure what caused the pain, but is having pain.  Patient states most movements are painful when he lifts his right arm above his head.   Patient states he took Tylenol  325 mg  2 hours ago.

## 2024-03-26 NOTE — Discharge Instructions (Addendum)
 Your x-ray showed that you have arthritis in your shoulder as well as some changes that might indicate your rotator cuff is inflamed.  Please apply diclofenac  up to 3 times a day to the skin to help with discomfort.  Avoid strenuous activity including heavy lifting.  Take prednisone  20 mg for 3 days.  Do not take additional NSAIDs with this medication including aspirin , ibuprofen/Advil, naproxen/Aleve.  You can use Tylenol /acetaminophen  as needed for additional pain relief.  If your symptoms are not significantly improved within a few days please follow-up with orthopedics; call to schedule appointment.  If anything worsens and you have increasing pain, numbness or tingling in your hands, fever, decreased range of motion you should be seen immediately.

## 2024-04-08 DIAGNOSIS — I2510 Atherosclerotic heart disease of native coronary artery without angina pectoris: Secondary | ICD-10-CM | POA: Diagnosis not present

## 2024-04-13 DIAGNOSIS — E7800 Pure hypercholesterolemia, unspecified: Secondary | ICD-10-CM | POA: Diagnosis not present

## 2024-04-13 DIAGNOSIS — N400 Enlarged prostate without lower urinary tract symptoms: Secondary | ICD-10-CM | POA: Diagnosis not present

## 2024-04-13 DIAGNOSIS — M810 Age-related osteoporosis without current pathological fracture: Secondary | ICD-10-CM | POA: Diagnosis not present

## 2024-04-13 DIAGNOSIS — I2510 Atherosclerotic heart disease of native coronary artery without angina pectoris: Secondary | ICD-10-CM | POA: Diagnosis not present

## 2024-05-08 DIAGNOSIS — I2510 Atherosclerotic heart disease of native coronary artery without angina pectoris: Secondary | ICD-10-CM | POA: Diagnosis not present

## 2024-05-14 DIAGNOSIS — I2510 Atherosclerotic heart disease of native coronary artery without angina pectoris: Secondary | ICD-10-CM | POA: Diagnosis not present

## 2024-05-14 DIAGNOSIS — M810 Age-related osteoporosis without current pathological fracture: Secondary | ICD-10-CM | POA: Diagnosis not present

## 2024-05-14 DIAGNOSIS — N400 Enlarged prostate without lower urinary tract symptoms: Secondary | ICD-10-CM | POA: Diagnosis not present

## 2024-05-14 DIAGNOSIS — E7800 Pure hypercholesterolemia, unspecified: Secondary | ICD-10-CM | POA: Diagnosis not present

## 2024-05-18 DIAGNOSIS — R350 Frequency of micturition: Secondary | ICD-10-CM | POA: Diagnosis not present

## 2024-05-18 DIAGNOSIS — S46812A Strain of other muscles, fascia and tendons at shoulder and upper arm level, left arm, initial encounter: Secondary | ICD-10-CM | POA: Diagnosis not present

## 2024-06-05 ENCOUNTER — Other Ambulatory Visit: Payer: PPO

## 2024-06-06 DIAGNOSIS — N401 Enlarged prostate with lower urinary tract symptoms: Secondary | ICD-10-CM | POA: Diagnosis not present

## 2024-06-07 ENCOUNTER — Ambulatory Visit (HOSPITAL_BASED_OUTPATIENT_CLINIC_OR_DEPARTMENT_OTHER)
Admission: RE | Admit: 2024-06-07 | Discharge: 2024-06-07 | Disposition: A | Discharge status: Home / Self Care | Source: Ambulatory Visit | Attending: Internal Medicine | Admitting: Internal Medicine

## 2024-06-07 DIAGNOSIS — M810 Age-related osteoporosis without current pathological fracture: Secondary | ICD-10-CM | POA: Insufficient documentation

## 2024-06-07 DIAGNOSIS — I2510 Atherosclerotic heart disease of native coronary artery without angina pectoris: Secondary | ICD-10-CM | POA: Diagnosis not present

## 2024-06-13 DIAGNOSIS — E7800 Pure hypercholesterolemia, unspecified: Secondary | ICD-10-CM | POA: Diagnosis not present

## 2024-06-13 DIAGNOSIS — I2510 Atherosclerotic heart disease of native coronary artery without angina pectoris: Secondary | ICD-10-CM | POA: Diagnosis not present

## 2024-06-13 DIAGNOSIS — M810 Age-related osteoporosis without current pathological fracture: Secondary | ICD-10-CM | POA: Diagnosis not present

## 2024-06-13 DIAGNOSIS — N400 Enlarged prostate without lower urinary tract symptoms: Secondary | ICD-10-CM | POA: Diagnosis not present

## 2024-06-27 DIAGNOSIS — H524 Presbyopia: Secondary | ICD-10-CM | POA: Diagnosis not present

## 2024-06-27 DIAGNOSIS — H43813 Vitreous degeneration, bilateral: Secondary | ICD-10-CM | POA: Diagnosis not present

## 2024-06-27 DIAGNOSIS — H04123 Dry eye syndrome of bilateral lacrimal glands: Secondary | ICD-10-CM | POA: Diagnosis not present

## 2024-06-27 DIAGNOSIS — H52203 Unspecified astigmatism, bilateral: Secondary | ICD-10-CM | POA: Diagnosis not present

## 2024-06-27 DIAGNOSIS — Z961 Presence of intraocular lens: Secondary | ICD-10-CM | POA: Diagnosis not present

## 2024-07-07 DIAGNOSIS — I2510 Atherosclerotic heart disease of native coronary artery without angina pectoris: Secondary | ICD-10-CM | POA: Diagnosis not present

## 2024-07-14 DIAGNOSIS — M810 Age-related osteoporosis without current pathological fracture: Secondary | ICD-10-CM | POA: Diagnosis not present

## 2024-07-14 DIAGNOSIS — N400 Enlarged prostate without lower urinary tract symptoms: Secondary | ICD-10-CM | POA: Diagnosis not present

## 2024-07-14 DIAGNOSIS — I2510 Atherosclerotic heart disease of native coronary artery without angina pectoris: Secondary | ICD-10-CM | POA: Diagnosis not present

## 2024-07-14 DIAGNOSIS — E7800 Pure hypercholesterolemia, unspecified: Secondary | ICD-10-CM | POA: Diagnosis not present

## 2024-07-26 NOTE — Progress Notes (Deleted)
 Cardiology Office Note:    Date:  07/26/2024   ID:  Sean Rosario, DOB 12-16-1945, MRN [id number]  PCP:  Charlott Dorn LABOR, MD  [facility] Cardiologist:  Maude Emmer, MD  [facility] Electrophysiologist:  None   Chief Complaint: yearly follow up   History of Present Illness:    Sean Rosario is a 78 y.o. male with a hx of pericarditis remotely, CAD by coronary CT, HLD, ocular myesthenia,  seen for follow up.   Echo 06/2020: LVEF 60-65%, grade 1 DD, mild to moderate TR Coronary CT 10/2021: extensive  3 V plaque. No significant stenosis. Calcium  score of 524. Multi spec nuc 10/2021: Normal stress myoview  with no evidence for ischemia or infarction.  Good exercise tolerance.   Here today for follow-up.  He goes to exercise club about 2 times per week.  He does stretching, weight lifting and cardio for 1 hour without any limitation.  He denies chest pain, shortness of breath, dizziness, palpitation, orthopnea, PND, syncope, lower extremity edema or melena.  Compliant with medications.  Seeing urology for BPH on [place]  Wife has macular and cannot drive His vision is poor latter in day 3 kids two at [place] one in [place] and one in [place]  ***  ***  Past Medical History:  Diagnosis Date   Actinic keratoses    Age related osteoporosis    Atherosclerotic heart disease of native coronary artery with angina pectoris    BPH (benign prostatic hyperplasia)    CAD (coronary artery disease)    Colon polyp, hyperplastic    History of anemia    Hypercholesterolemia    Hyperlipidemia    Patient previously elected lifestyle changes in lieu of meds   Impaired fasting glucose    Myasthenia gravis    Only manifested by intermittent double vision. Maintained on mestinon    Ocular myasthenia    Osteoporosis    Pericarditis    20 years ago    Past Surgical History:  Procedure Laterality Date   INGUINAL HERNIA REPAIR Right    TONSILLECTOMY      Current Medications: No  outpatient medications have been marked as taking for the 08/04/24 encounter (Appointment) with Coralie Stanke C, MD.     Allergies:   Patient has no known allergies.   Social History   Socioeconomic History   Marital status: Married    Spouse name: Not on file   Number of children: 2   Years of education: Not on file   Highest education level: Not on file  Occupational History   Not on file  Tobacco Use   Smoking status: Never   Smokeless tobacco: Never  Vaping Use   Vaping status: Never Used  Substance and Sexual Activity   Alcohol use: Yes    Alcohol/week: 10.0 standard drinks of alcohol    Types: 10 Glasses of wine per week    Comment: 2 glasses of wine per night most, but not all nights   Drug use: No   Sexual activity: Not on file  Other Topics Concern   Not on file  Social History Narrative   He is retired from engineer, site.   Lives with wife and mother (56).  They have two children.   Right handed   Two story home   Drinks caffeine   Social Drivers of Corporate Investment Banker Strain: Not on file  Food Insecurity: Not on file  Transportation Needs: Not on file  Physical Activity:  Not on file  Stress: Not on file  Social Connections: Not on file     Family History: The patient's family history includes ALS in his father; Epilepsy in his sister; Heart defect in his father; Hypertension in his mother and sister.   ROS:   Please see the history of present illness.    All other systems reviewed and are negative.  EKGs/Labs/Other Studies Reviewed:    The following studies were reviewed today: Echo 06/2020 1. Left ventricular ejection fraction, by estimation, is 60 to 65%. The  left ventricle has normal function. The left ventricle has no regional  wall motion abnormalities. Left ventricular diastolic parameters are  consistent with Grade I diastolic  dysfunction (impaired relaxation).   2. Right ventricular systolic function is normal. The  right ventricular  size is normal. There is normal pulmonary artery systolic pressure. The  estimated right ventricular systolic pressure is 29.0 mmHg.   3. The mitral valve is normal in structure. Trivial mitral valve  regurgitation. No evidence of mitral stenosis.   4. Tricuspid valve regurgitation is mild to moderate.   5. The aortic valve is tricuspid. Aortic valve regurgitation is not  visualized. No aortic stenosis is present.   6. The inferior vena cava is normal in size with greater than 50%  respiratory variability, suggesting right atrial pressure of 3 mmHg.   Stress test 10/2021 Findings: The patient exercised 12 minutes reaching > 85% MPHR.  There were nonspecific changes on the ECG, no chest pain.   Perfusion images showed no perfusion defect at rest or with stress.   Gated images showed normal wall motion, EF 68%   IMPRESSION:  Normal stress myoview  with no evidence for ischemia or infarction.  Good exercise tolerance.   Coronary CT 10/2021 CORONARY ARTERIES:  Left main coronary artery:  Short vessel  of normal caliber.  There  is an ecentric calcified plaque which is nonobstructive.   Left anterior descending artery: There are two proximal eccentric  calcified plaques.  The second plaque demonstrates between 25 and  50% luminal narrowing.  There is a prominent diagonal branch which  also contains eccentric calcified plaque which is nonobstructive.   Left circumflex:  A large vessel with an early branching obtuse  marginal.  There is a small eccentric plaque within the proximal  obtuse marginal.   Right coronary artery:  The right coronary is diminutive.  There  are several eccentric calcified plaque  Posterior descending artery:  Normal caliber with nonobstructed  calcified plaque  Dominance:  Left   CORONARY CALCIUM :  Total Agatston Score:  524  MESA database percentile:  82   CARDIAC MEASUREMENTS:  Interventricular septum (6 - 12 mm):  LV posterior wall  (6 - 12 mm):  LV diameter in diastole (35 - 52 mm):   AORTA AND PULMONARY MEASUREMENTS:  Aortic root (21 - 40 mm):              30 mm  at the annulus              39 mm  at the sinuses of Valsalva              28 mm  at the sinotubular junction  Ascending aorta ( <  40 mm):  31 mm  Descending aorta ( <  40 mm):  There is a 25 mm  Main pulmonary artery:  ( <  30 mm):  24 mm   EXTRACARDIAC FINDINGS: There is soft tissue  thickening associated  with a pleural plaque within the posterior right lower lobe. This  is incompletely imaged.  The imaged portion measures 35 x 13 mm  (image #1).  Mild bronchiectasis at the lung bases.   Limited view of the upper abdomen is unremarkable.   View of the skeleton is unremarkable.   IMPRESSION:   1.  Extensive three-vessel eccentric calcified plaque. No  significant stenosis.   2. The patient's total coronary artery calcium  score is 524, which  is 82 percentile for patient's matched age and gender.   3.  Left coronary artery dominance.   4.  Nodular pleural thickening associated with a pleural plaque in  the right lower lobe.  This is incompletely imaged.  Recommend non  emergent CT of the thorax for further evaluation and to serve as a  baseline for follow up.   EKG:  EKG is ordered today.  The ekg ordered today demonstrates sinus bradycardia  Recent Labs: No results found for requested labs within last 365 days.  Recent Lipid Panel    Component Value Date/Time   CHOL 265 (H) 11/12/2011 0700   TRIG 90 11/12/2011 0700   HDL 92 11/12/2011 0700   CHOLHDL 2.9 11/12/2011 0700   VLDL 18 11/12/2011 0700   LDLCALC 155 (H) 11/12/2011 0700   Physical Exam:    VS:  There were no vitals taken for this visit.    Wt Readings from Last 3 Encounters:  12/20/23 132 lb (59.9 kg)  07/06/23 133 lb (60.3 kg)  12/23/22 130 lb (59 kg)     GEN:  Well nourished, well developed in no acute distress HEENT: Normal NECK: No JVD; No carotid  bruits LYMPHATICS: No lymphadenopathy CARDIAC: RRR, no murmurs, rubs, gallops RESPIRATORY:  Clear to auscultation without rales, wheezing or rhonchi  ABDOMEN: Soft, non-tender, non-distended MUSCULOSKELETAL:  No edema; No deformity  SKIN: Warm and dry NEUROLOGIC:  Alert and oriented x 3 PSYCHIATRIC:  Normal affect   ASSESSMENT AND PLAN:    CAD No anginal symptoms.  Continue aspirin  And statin.  Normal myovue 06/28/20   2. Hyperlipidemia -Continue statin.  Labs with primary target LDL <70  3. Ocular Myasthenia on mestinon  f/u neurology :    Medication Adjustments/Labs and Tests Ordered: Current medicines are reviewed at length with the patient today.  Concerns regarding medicines are outlined above.  No orders of the defined types were placed in this encounter.  No orders of the defined types were placed in this encounter.   There are no Patient Instructions on file for this visit.   Signed, Maude Emmer, MD  07/26/2024 1:00 PM    [facility]

## 2024-08-04 ENCOUNTER — Ambulatory Visit: Admitting: Cardiovascular Disease

## 2024-08-06 DIAGNOSIS — I2510 Atherosclerotic heart disease of native coronary artery without angina pectoris: Secondary | ICD-10-CM | POA: Diagnosis not present

## 2024-09-25 NOTE — Progress Notes (Signed)
 " Cardiology Office Note:    Date:  09/25/2024   ID:  Sean Rosario, DOB 1946/04/21, MRN [id number]  PCP:  Charlott Dorn LABOR, MD  [facility] Cardiologist:  Maude Emmer, MD  [facility] Electrophysiologist:  None   Chief Complaint: yearly follow up   History of Present Illness:    Sean Rosario is a 79 y.o. male with a hx of pericarditis remotely, CAD by coronary CT, HLD, ocular myesthenia,  seen for follow up.   Echo 06/2020: LVEF 60-65%, grade 1 DD, mild to moderate TR Coronary CT 10/2021: extensive  3 V plaque. No significant stenosis. Calcium  score of 524. Multi spec nuc 10/2021: Normal stress myoview  with no evidence for ischemia or infarction.  Good exercise tolerance.   Here today for follow-up.  He goes to exercise club about 2 times per week.  He does stretching, weight lifting and cardio for 1 hour without any limitation.  He denies chest pain, shortness of breath, dizziness, palpitation, orthopnea, PND, syncope, lower extremity edema or melena.  Compliant with medications.  Wife has macular and cannot drive His vision is poor latter in day 3 kids one at [place] one in [place] and one in [place]  He would like to have stress test since its been a while Mild dyspnea at the club when doing aerobic activity  Past Medical History:  Diagnosis Date   Actinic keratoses    Age related osteoporosis    Atherosclerotic heart disease of native coronary artery with angina pectoris    BPH (benign prostatic hyperplasia)    CAD (coronary artery disease)    Colon polyp, hyperplastic    History of anemia    Hypercholesterolemia    Hyperlipidemia    Patient previously elected lifestyle changes in lieu of meds   Impaired fasting glucose    Myasthenia gravis    Only manifested by intermittent double vision. Maintained on mestinon    Ocular myasthenia    Osteoporosis    Pericarditis    20 years ago    Past Surgical History:  Procedure Laterality Date   INGUINAL HERNIA  REPAIR Right    TONSILLECTOMY      Current Medications: No outpatient medications have been marked as taking for the 10/06/24 encounter (Appointment) with Katheren Jimmerson C, MD.     Allergies:   Patient has no known allergies.   Social History   Socioeconomic History   Marital status: Married    Spouse name: Not on file   Number of children: 2   Years of education: Not on file   Highest education level: Not on file  Occupational History   Not on file  Tobacco Use   Smoking status: Never   Smokeless tobacco: Never  Vaping Use   Vaping status: Never Used  Substance and Sexual Activity   Alcohol use: Yes    Alcohol/week: 10.0 standard drinks of alcohol    Types: 10 Glasses of wine per week    Comment: 2 glasses of wine per night most, but not all nights   Drug use: No   Sexual activity: Not on file  Other Topics Concern   Not on file  Social History Narrative   He is retired from engineer, site.   Lives with wife and mother (35).  They have two children.   Right handed   Two story home   Drinks caffeine   Social Drivers of Health   Tobacco Use: Low Risk (06/06/2024)   Received from  [facility]   Patient History    Smoking Tobacco Use: Never    Smokeless Tobacco Use: Never    Passive Exposure: Not on file  Financial Resource Strain: Not on file  Food Insecurity: Not on file  Transportation Needs: Not on file  Physical Activity: Not on file  Stress: Not on file  Social Connections: Not on file  Depression (EYV7-0): Not on file  Alcohol Screen: Not on file  Housing: Not on file  Utilities: Not on file  Health Literacy: Not on file     Family History: The patient's family history includes ALS in his father; Epilepsy in his sister; Heart defect in his father; Hypertension in his mother and sister.   ROS:   Please see the history of present illness.    All other systems reviewed and are negative.  EKGs/Labs/Other Studies Reviewed:    The following  studies were reviewed today: Echo 06/2020 1. Left ventricular ejection fraction, by estimation, is 60 to 65%. The  left ventricle has normal function. The left ventricle has no regional  wall motion abnormalities. Left ventricular diastolic parameters are  consistent with Grade I diastolic  dysfunction (impaired relaxation).   2. Right ventricular systolic function is normal. The right ventricular  size is normal. There is normal pulmonary artery systolic pressure. The  estimated right ventricular systolic pressure is 29.0 mmHg.   3. The mitral valve is normal in structure. Trivial mitral valve  regurgitation. No evidence of mitral stenosis.   4. Tricuspid valve regurgitation is mild to moderate.   5. The aortic valve is tricuspid. Aortic valve regurgitation is not  visualized. No aortic stenosis is present.   6. The inferior vena cava is normal in size with greater than 50%  respiratory variability, suggesting right atrial pressure of 3 mmHg.   Stress test 10/2021 Findings: The patient exercised 12 minutes reaching > 85% MPHR.  There were nonspecific changes on the ECG, no chest pain.   Perfusion images showed no perfusion defect at rest or with stress.   Gated images showed normal wall motion, EF 68%   IMPRESSION:  Normal stress myoview  with no evidence for ischemia or infarction.  Good exercise tolerance.   Coronary CT 10/2021 CORONARY ARTERIES:  Left main coronary artery:  Short vessel  of normal caliber.  There  is an ecentric calcified plaque which is nonobstructive.   Left anterior descending artery: There are two proximal eccentric  calcified plaques.  The second plaque demonstrates between 25 and  50% luminal narrowing.  There is a prominent diagonal branch which  also contains eccentric calcified plaque which is nonobstructive.   Left circumflex:  A large vessel with an early branching obtuse  marginal.  There is a small eccentric plaque within the proximal  obtuse  marginal.   Right coronary artery:  The right coronary is diminutive.  There  are several eccentric calcified plaque  Posterior descending artery:  Normal caliber with nonobstructed  calcified plaque  Dominance:  Left   CORONARY CALCIUM :  Total Agatston Score:  524  MESA database percentile:  82   CARDIAC MEASUREMENTS:  Interventricular septum (6 - 12 mm):  LV posterior wall (6 - 12 mm):  LV diameter in diastole (35 - 52 mm):   AORTA AND PULMONARY MEASUREMENTS:  Aortic root (21 - 40 mm):              30 mm  at the annulus  39 mm  at the sinuses of Valsalva              28 mm  at the sinotubular junction  Ascending aorta ( <  40 mm):  31 mm  Descending aorta ( <  40 mm):  There is a 25 mm  Main pulmonary artery:  ( <  30 mm):  24 mm   EXTRACARDIAC FINDINGS: There is soft tissue thickening associated  with a pleural plaque within the posterior right lower lobe. This  is incompletely imaged.  The imaged portion measures 35 x 13 mm  (image #1).  Mild bronchiectasis at the lung bases.   Limited view of the upper abdomen is unremarkable.   View of the skeleton is unremarkable.   IMPRESSION:   1.  Extensive three-vessel eccentric calcified plaque. No  significant stenosis.   2. The patient's total coronary artery calcium  score is 524, which  is 82 percentile for patient's matched age and gender.   3.  Left coronary artery dominance.   4.  Nodular pleural thickening associated with a pleural plaque in  the right lower lobe.  This is incompletely imaged.  Recommend non  emergent CT of the thorax for further evaluation and to serve as a  baseline for follow up.   EKG:  EKG is ordered today.  The ekg ordered today demonstrates sinus bradycardia  Recent Labs: No results found for requested labs within last 365 days.  Recent Lipid Panel    Component Value Date/Time   CHOL 265 (H) 11/12/2011 0700   TRIG 90 11/12/2011 0700   HDL 92 11/12/2011 0700   CHOLHDL  2.9 11/12/2011 0700   VLDL 18 11/12/2011 0700   LDLCALC 155 (H) 11/12/2011 0700   Physical Exam:    VS:  There were no vitals taken for this visit.    Wt Readings from Last 3 Encounters:  12/20/23 59.9 kg  07/06/23 60.3 kg  12/23/22 59 kg    Affect appropriate Healthy:  appears stated age HEENT: normal Neck supple with no adenopathy JVP normal no bruits no thyromegaly Lungs clear with no wheezing and good diaphragmatic motion Heart:  S1/S2 no murmur, no rub, gallop or click PMI normal Abdomen: benighn, BS positve, no tenderness, no AAA no bruit.  No HSM or HJR Distal pulses intact with no bruits No edema Neuro non-focal Skin warm and dry No muscular weakness   ASSESSMENT AND PLAN:    CAD Mild exertional dyspnea update exercise myovue Continue statin and ASA  2. Hyperlipidemia -Continue statin.  Labs with primary target LDL <70  3. Ocular Myasthenia on mestinon  f/u neurology Mild diplopia worse in evenings    Ex Myovue   F/U in a year    Signed, Maude Emmer, MD  09/25/2024 12:18 PM    [facility] "

## 2024-10-06 ENCOUNTER — Ambulatory Visit: Attending: Cardiovascular Disease | Admitting: Cardiovascular Disease

## 2024-10-06 ENCOUNTER — Encounter: Payer: Self-pay | Admitting: Cardiovascular Disease

## 2024-10-06 VITALS — BP 110/68 | HR 56 | Ht 65.0 in | Wt 133.0 lb

## 2024-10-06 DIAGNOSIS — I2510 Atherosclerotic heart disease of native coronary artery without angina pectoris: Secondary | ICD-10-CM | POA: Diagnosis not present

## 2024-10-06 DIAGNOSIS — E782 Mixed hyperlipidemia: Secondary | ICD-10-CM | POA: Diagnosis not present

## 2024-10-06 DIAGNOSIS — G7000 Myasthenia gravis without (acute) exacerbation: Secondary | ICD-10-CM | POA: Diagnosis not present

## 2024-10-06 NOTE — Patient Instructions (Signed)
 Medication Instructions:  Your physician recommends that you continue on your current medications as directed. Please refer to the Current Medication list given to you today.  *If you need a refill on your cardiac medications before your next appointment, please call your pharmacy*  Lab Work: NONE ordered at this time of appointment   Testing/Procedures: Exercise Myoview  Test  Follow-Up: At [facility], you and your health needs are our priority.  As part of our continuing mission to provide you with exceptional heart care, our providers are all part of one team.  This team includes your primary Cardiologist (physician) and Advanced Practice Providers or APPs (Physician Assistants and Nurse Practitioners) who all work together to provide you with the care you need, when you need it.  Your next appointment:   1 year(s)  Provider:   Maude Emmer, MD    We recommend signing up for the patient portal called MyChart.  Sign up information is provided on this After Visit Summary.  MyChart is used to connect with patients for Virtual Visits (Telemedicine).  Patients are able to view lab/test results, encounter notes, upcoming appointments, etc.  Non-urgent messages can be sent to your provider as well.   To learn more about what you can do with MyChart, go to [email or web address].   Other Instructions How to Prepare for Your Myoview  Test (stress test):  1.Please do not take these medications before your test:  (please note if this is an exercise test pt should hold beta blocker prior) 2.Your remaining medications may be taken with water. 3.Nothing to eat or drink, except water, 4 hours prior to arrival time.  NO caffeine/decaffeinated products, or chocolate 12 hours prior to arrival. 4.Ladies, please do not wear dresses.  Skirts or pants are approprate, please wear a short sleeve shirt. 5.NO perfume, cologne or lotion 6.Wear comfortable walking shoes.  NO HEELS! 7.Total time is  3 to 4 hours; you may want to bring reading material for the waiting time. 8.Please report to [facility] for your test  What to expect after you arrive:  Once you arrive and check in for your appointment an IV will be started in your arm.  Then the Technoligist will inject a small amount of radioactive tracer.  There will be a 1 hour waiting period after this injection.  A series of pictures will be taken of your heart following this waiting period.  You will be prepped for the stress portion of the test.  During the stress portion of your test you will either walk on a treadmill or receive a small, safe amount of radioactive tracer injected in your IV.  After the stress portion, there is a short rest period during which time your heart and blood pressure will be monitored.  After the short rest period the Technologist will begin your second set of pictures.  Your doctor will inform you of your test results within 7-10 business days.  In preparation for your appointment, medication and supplies will be purchased.  Appointment availability is limited, so if you need to cancel or reschedule please call the office at [phone] 24 hours in advance to avoid a cancellation fee of $100.00  IF YOU THINK YOU MAY BE PREGNANT, PLEASE INFORM THE TECHNOLOGIST.

## 2024-10-30 ENCOUNTER — Ambulatory Visit (HOSPITAL_COMMUNITY)

## 2024-12-19 ENCOUNTER — Ambulatory Visit: Admitting: Neurology
